# Patient Record
Sex: Male | Born: 1980 | Race: White | Hispanic: No | Marital: Married | Smoking: Never smoker
Health system: Southern US, Community
[De-identification: ages and names within clinical notes are randomized; demographics above are authoritative.]

## PROBLEM LIST (undated history)

## (undated) DIAGNOSIS — D649 Anemia, unspecified: Secondary | ICD-10-CM

## (undated) DIAGNOSIS — K649 Unspecified hemorrhoids: Secondary | ICD-10-CM

## (undated) DIAGNOSIS — F32A Depression, unspecified: Secondary | ICD-10-CM

## (undated) DIAGNOSIS — M1990 Unspecified osteoarthritis, unspecified site: Secondary | ICD-10-CM

## (undated) DIAGNOSIS — S90229A Contusion of unspecified lesser toe(s) with damage to nail, initial encounter: Secondary | ICD-10-CM

## (undated) DIAGNOSIS — F419 Anxiety disorder, unspecified: Secondary | ICD-10-CM

## (undated) DIAGNOSIS — G709 Myoneural disorder, unspecified: Secondary | ICD-10-CM

## (undated) DIAGNOSIS — K625 Hemorrhage of anus and rectum: Secondary | ICD-10-CM

## (undated) DIAGNOSIS — F1910 Other psychoactive substance abuse, uncomplicated: Secondary | ICD-10-CM

## (undated) DIAGNOSIS — R109 Unspecified abdominal pain: Secondary | ICD-10-CM

## (undated) DIAGNOSIS — K635 Polyp of colon: Secondary | ICD-10-CM

## (undated) DIAGNOSIS — K561 Intussusception: Secondary | ICD-10-CM

## (undated) HISTORY — DX: Other psychoactive substance abuse, uncomplicated: F19.10

## (undated) HISTORY — DX: Unspecified hemorrhoids: K64.9

## (undated) HISTORY — DX: Unspecified abdominal pain: R10.9

## (undated) HISTORY — DX: Intussusception: K56.1

## (undated) HISTORY — DX: Contusion of unspecified lesser toe(s) with damage to nail, initial encounter: S90.229A

## (undated) HISTORY — DX: Myoneural disorder, unspecified: G70.9

## (undated) HISTORY — DX: Anemia, unspecified: D64.9

## (undated) HISTORY — DX: Hemorrhage of anus and rectum: K62.5

## (undated) HISTORY — DX: Anxiety disorder, unspecified: F41.9

## (undated) HISTORY — DX: Polyp of colon: K63.5

## (undated) HISTORY — DX: Unspecified osteoarthritis, unspecified site: M19.90

## (undated) HISTORY — DX: Depression, unspecified: F32.A

## (undated) HISTORY — PX: COLECTOMY: SHX59

---

## 2005-02-10 ENCOUNTER — Inpatient Hospital Stay (HOSPITAL_COMMUNITY): Admission: EM | Admit: 2005-02-10 | Discharge: 2005-02-17 | Payer: Self-pay | Admitting: Emergency Medicine

## 2005-03-18 ENCOUNTER — Encounter: Admission: RE | Admit: 2005-03-18 | Discharge: 2005-03-18 | Payer: Self-pay | Admitting: General Surgery

## 2006-03-20 IMAGING — RF DG UGI W/ SMALL BOWEL HIGH DENSITY
19 of 24 series · 19 of 24 positions shown · non-contrast
Comparison: none

CLINICAL DATA: Small bowel intussusception from hamartoma.  Evaluate for other polypoid lesions.
 KUB:
 A preliminary film of the abdomen shows a nonspecific bowel gas pattern. 
 UPPER GI AND SMALL BOWEL FOLLOW THROUGH:
 A double contrast upper GI shows the esophagus to appear normal.  Esophageal peristalsis is normal.  No hiatal hernia or reflux is seen. The stomach is normal in contour and peristalsis. The duodenal bulb fills well with no ulceration, and the duodenal loop is in normal position.
 The patient was given additional barium orally and images of the small bowel were obtained.  The mucosa of the small bowel appears normal.  No persistent filling defect is seen, and no dilatation or edema is noted.  The terminal ileum appears normal.

[Series 2: run · 1 of 1 slices shown (1 of 19)]
[im 1/1]
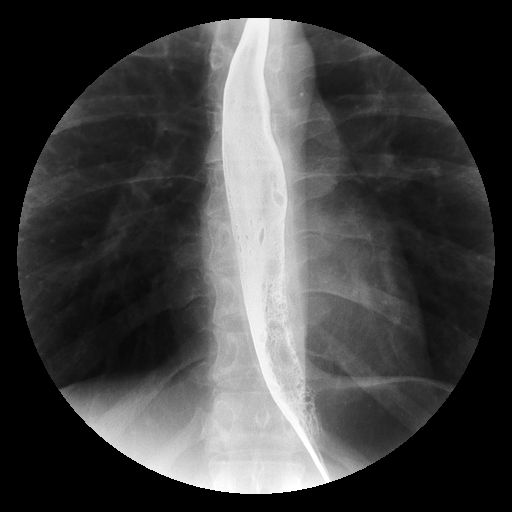

[Series 3: run · 1 of 1 slices shown (2 of 19)]
[im 1/1]
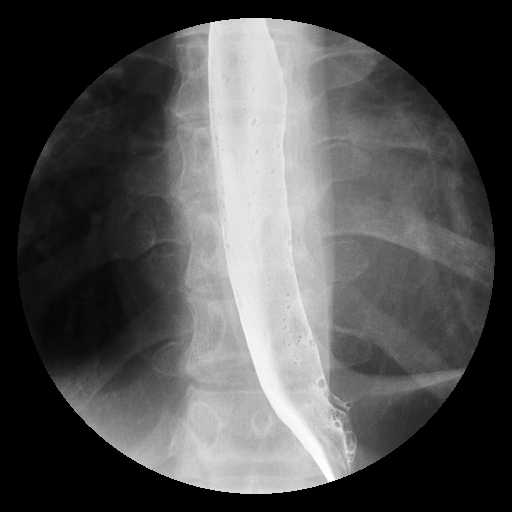

[Series 5: run · 1 of 1 slices shown (3 of 19)]
[im 1/1]
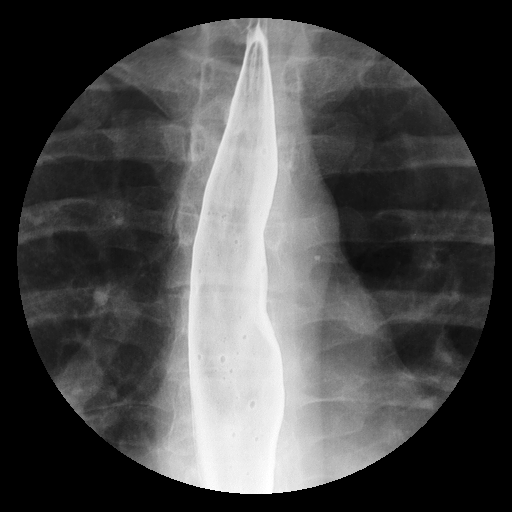

[Series 6: run · 1 of 1 slices shown (4 of 19)]
[im 1/1]
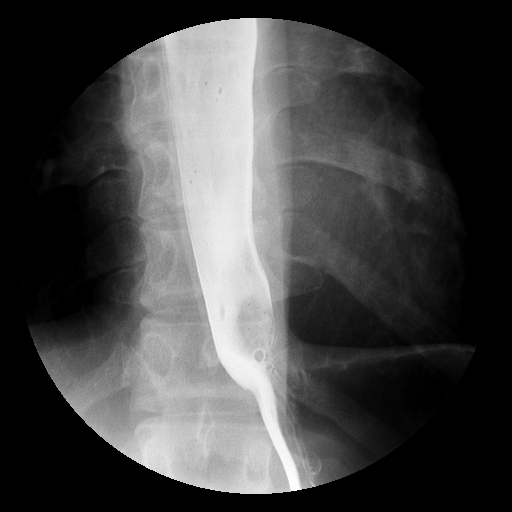

[Series 7: run · 1 of 1 slices shown (5 of 19)]
[im 1/1]
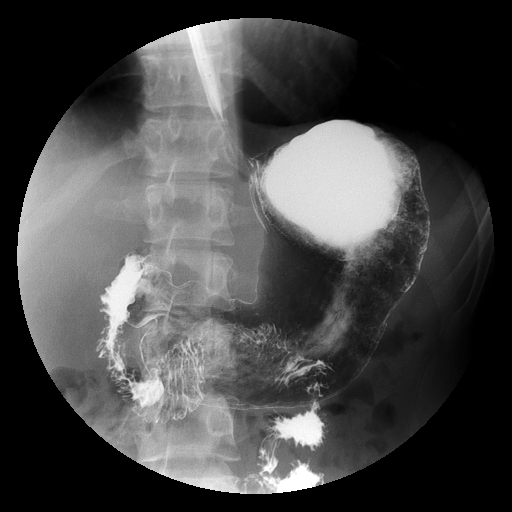

[Series 8: run · 1 of 1 slices shown (6 of 19)]
[im 1/1]
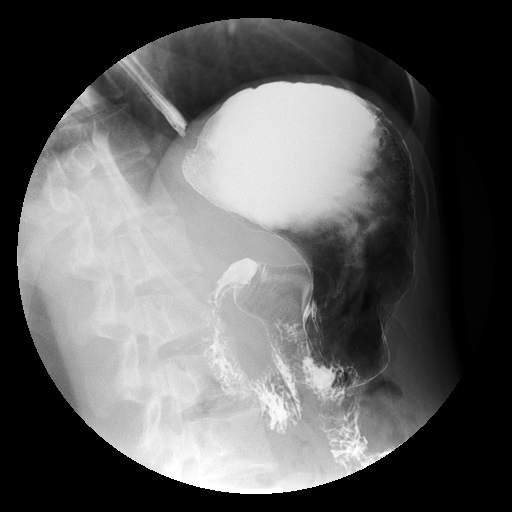

[Series 10: run · 1 of 1 slices shown (7 of 19)]
[im 1/1]
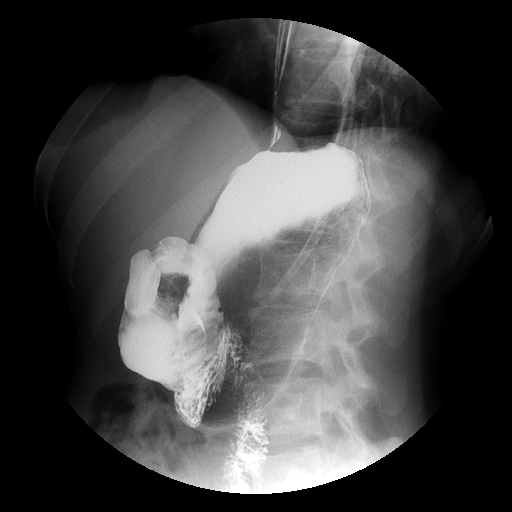

[Series 11: run · 1 of 1 slices shown (8 of 19)]
[im 1/1]
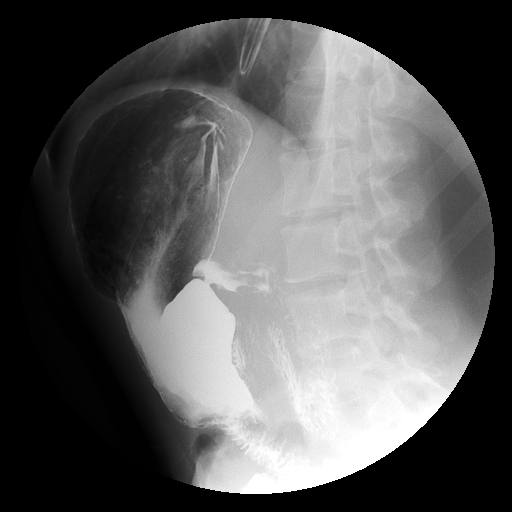

[Series 12: run · 1 of 1 slices shown (9 of 19)]
[im 1/1]
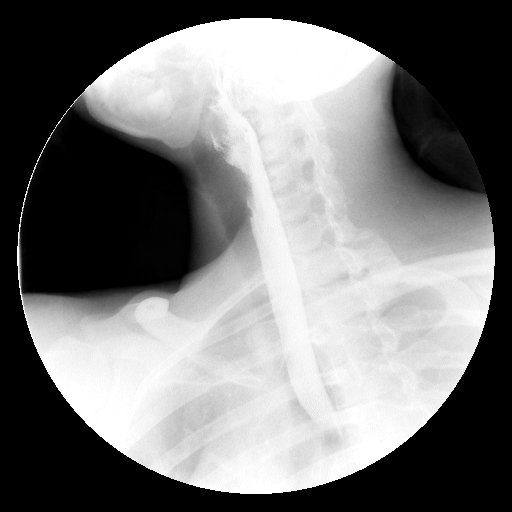

[Series 14: run · 1 of 1 slices shown (10 of 19)]
[im 1/1]
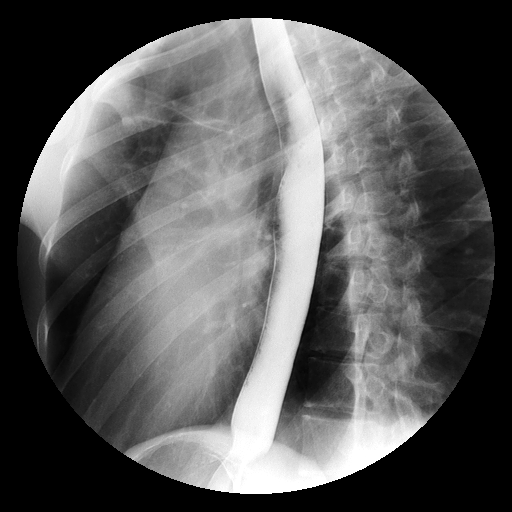

[Series 15: run · 1 of 1 slices shown (11 of 19)]
[im 1/1]
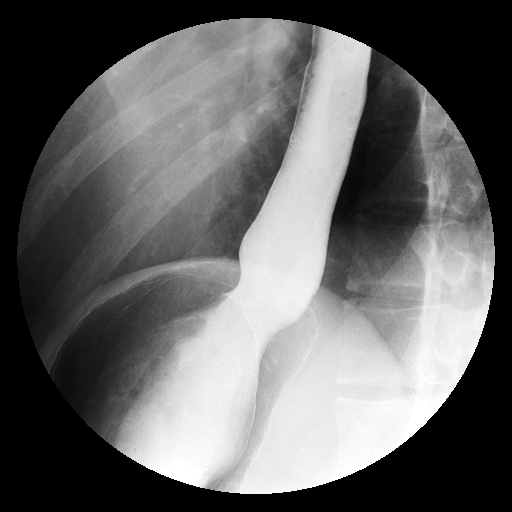

[Series 16: run · 1 of 1 slices shown (12 of 19)]
[im 1/1]
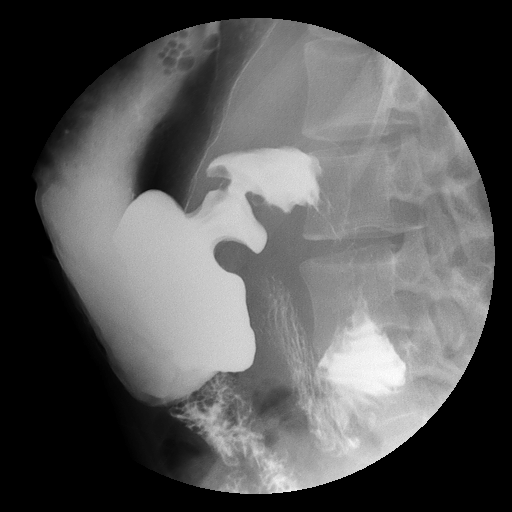

[Series 17: run · 1 of 1 slices shown (13 of 19)]
[im 1/1]
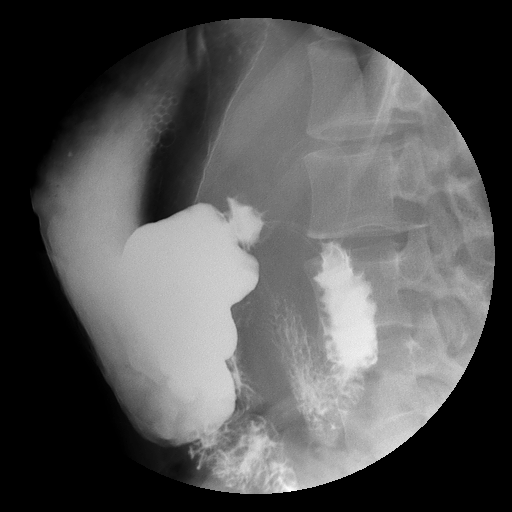

[Series 19: run · 1 of 1 slices shown (14 of 19)]
[im 1/1]
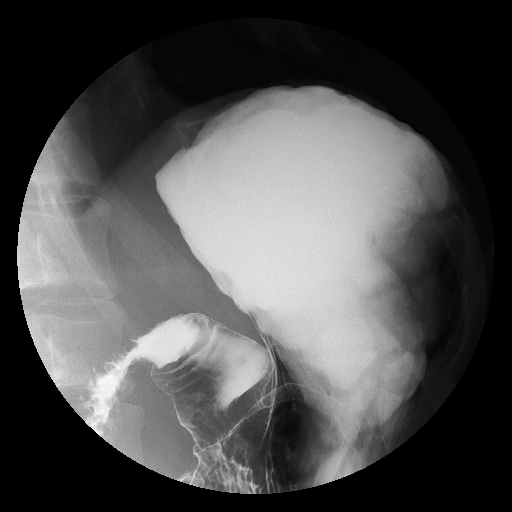

[Series 20: run · 1 of 1 slices shown (15 of 19)]
[im 1/1]
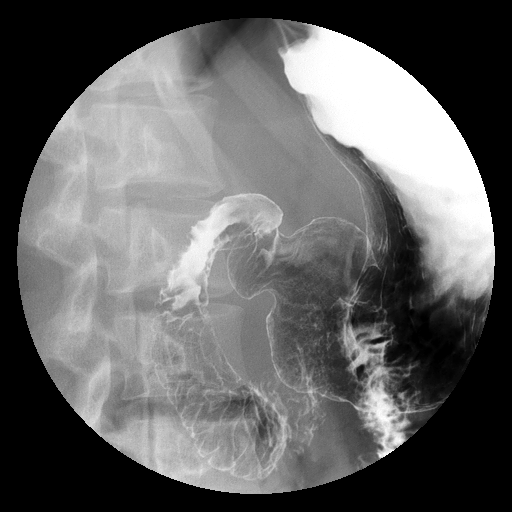

[Series 21: run · 1 of 1 slices shown (16 of 19)]
[im 1/1]
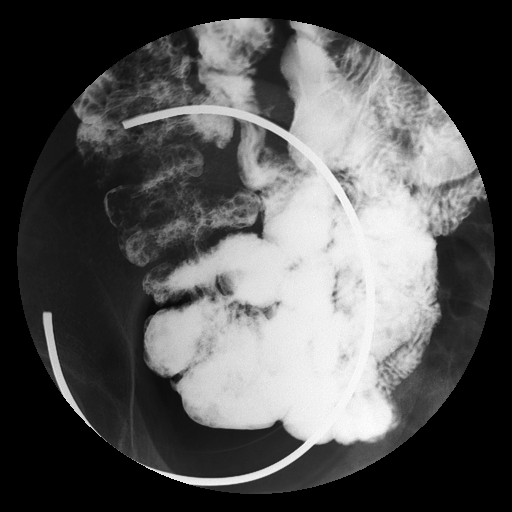

[Series 22: run · 1 of 1 slices shown (17 of 19)]
[im 1/1]
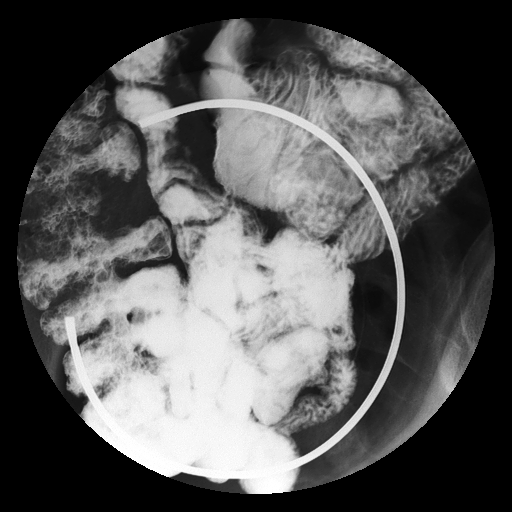

[Series 24: run · 1 of 1 slices shown (18 of 19)]
[im 1/1]
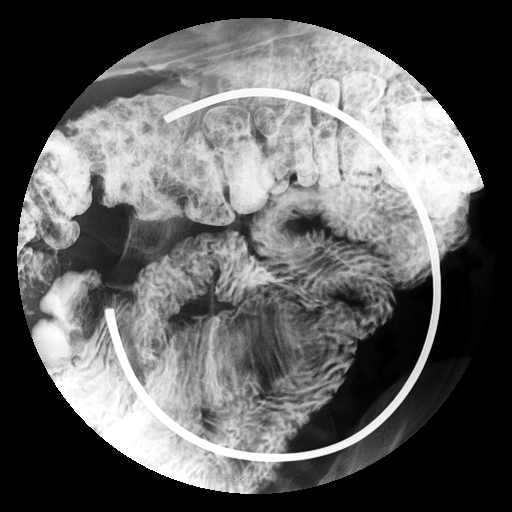

[Series 25: run · 1 of 1 slices shown (19 of 19)]
[im 1/1]
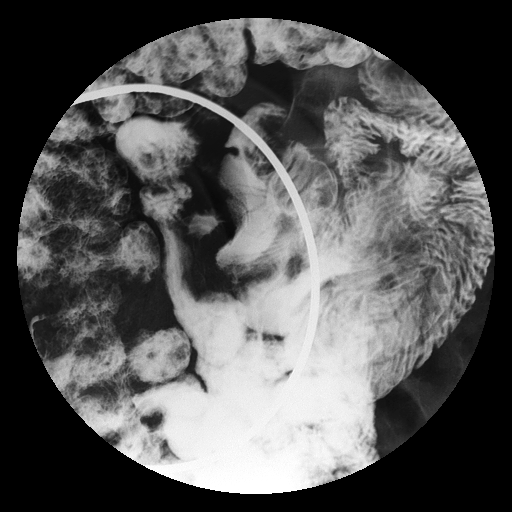

[19 of 24 positions shown; findings below may reference images not displayed]

IMPRESSION: 1.  Negative upper GI.
 2.  Negative small bowel follow through.

## 2017-04-09 ENCOUNTER — Encounter (HOSPITAL_COMMUNITY): Payer: Self-pay | Admitting: Emergency Medicine

## 2017-04-09 ENCOUNTER — Ambulatory Visit (HOSPITAL_COMMUNITY)
Admission: EM | Admit: 2017-04-09 | Discharge: 2017-04-09 | Disposition: A | Discharge status: Home / Self Care | Payer: Medicaid Other | Attending: Family Medicine | Admitting: Family Medicine

## 2017-04-09 DIAGNOSIS — R5383 Other fatigue: Secondary | ICD-10-CM | POA: Diagnosis present

## 2017-04-09 DIAGNOSIS — F1290 Cannabis use, unspecified, uncomplicated: Secondary | ICD-10-CM | POA: Insufficient documentation

## 2017-04-09 DIAGNOSIS — D649 Anemia, unspecified: Secondary | ICD-10-CM | POA: Diagnosis not present

## 2017-04-09 DIAGNOSIS — F4310 Post-traumatic stress disorder, unspecified: Secondary | ICD-10-CM | POA: Insufficient documentation

## 2017-04-09 LAB — TSH: TSH: 0.921 u[IU]/mL (ref 0.350–4.500)

## 2017-04-09 LAB — CBC
HCT: 49 % (ref 39.0–52.0)
Hemoglobin: 17.2 g/dL — ABNORMAL HIGH (ref 13.0–17.0)
MCH: 31.4 pg (ref 26.0–34.0)
MCHC: 35.1 g/dL (ref 30.0–36.0)
MCV: 89.6 fL (ref 78.0–100.0)
PLATELETS: 166 10*3/uL (ref 150–400)
RBC: 5.47 MIL/uL (ref 4.22–5.81)
RDW: 12.7 % (ref 11.5–15.5)
WBC: 6 10*3/uL (ref 4.0–10.5)

## 2017-04-09 NOTE — Discharge Instructions (Addendum)
We will contact you with your lab results. 

## 2017-04-09 NOTE — ED Triage Notes (Signed)
Pt c/o feeling fatigue associated w/SOB w/exertion onset x3 days  Reports he recently became vegan in the past month  Taking iron supplements   Concerned for anemia... Hx of anemia   A&O x4... NAD... Ambulatory
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
## 2018-06-16 ENCOUNTER — Ambulatory Visit: Payer: Self-pay | Admitting: Family Medicine

## 2018-06-19 ENCOUNTER — Encounter: Payer: Self-pay | Admitting: Family Medicine

## 2018-06-19 ENCOUNTER — Ambulatory Visit (INDEPENDENT_AMBULATORY_CARE_PROVIDER_SITE_OTHER): Payer: Medicaid Other | Admitting: Family Medicine

## 2018-06-19 VITALS — BP 118/84 | HR 64 | Temp 98.0°F | Ht 68.0 in | Wt 137.0 lb

## 2018-06-19 DIAGNOSIS — Z131 Encounter for screening for diabetes mellitus: Secondary | ICD-10-CM

## 2018-06-19 DIAGNOSIS — Z7689 Persons encountering health services in other specified circumstances: Secondary | ICD-10-CM | POA: Diagnosis not present

## 2018-06-19 DIAGNOSIS — Z09 Encounter for follow-up examination after completed treatment for conditions other than malignant neoplasm: Secondary | ICD-10-CM

## 2018-06-19 DIAGNOSIS — R197 Diarrhea, unspecified: Secondary | ICD-10-CM | POA: Diagnosis not present

## 2018-06-19 DIAGNOSIS — Z0000 Encounter for general adult medical examination without abnormal findings: Secondary | ICD-10-CM | POA: Diagnosis not present

## 2018-06-19 DIAGNOSIS — R5383 Other fatigue: Secondary | ICD-10-CM

## 2018-06-19 LAB — POCT URINALYSIS DIP (MANUAL ENTRY)
Bilirubin, UA: NEGATIVE
Blood, UA: NEGATIVE
Glucose, UA: NEGATIVE mg/dL
Ketones, POC UA: NEGATIVE mg/dL
Leukocytes, UA: NEGATIVE
Nitrite, UA: NEGATIVE
Protein Ur, POC: NEGATIVE mg/dL
Spec Grav, UA: 1.01 (ref 1.010–1.025)
Urobilinogen, UA: 0.2 E.U./dL
pH, UA: 6.5 (ref 5.0–8.0)

## 2018-06-19 LAB — POCT GLYCOSYLATED HEMOGLOBIN (HGB A1C): Hemoglobin A1C: 4.8 % (ref 4.0–5.6)

## 2018-06-19 NOTE — Progress Notes (Signed)
New Patient--Establish Care  Subjective:    Patient ID: Jackson [name], male    DOB: [birth year], 37 y.o.   MRN: [id number]   Chief Complaint  Patient presents with  . Establish Care   HPI  [name] is a 37 year old male with a past medical history of Acute Sinusitis. He is here today to establish care.   Current Status: Patient is here today be tested for Addison's Disease. He also has a family history of Hyperthyroidism. He has been experiencing increase fatigue. He denies fevers, chills, fatigue, recent infections, weight loss, and night sweats. He continues to experience occasional diarrhea. He has tried 'gluten free' diet for a few months. No reports of other GI problems such as nausea, vomiting, and constipation. He has no reports of blood in stools, dysuria and hematuria.  He has increase muscle aches and cramps, which he takes Cannibis, Magnesium/Chloride baths, Yoga and Motrin as needed. He recently investigated heavy metals and hair follicle test and is currently waiting for results. He practices meditation for relief of anxiety. No depression or anxiety reported.  He has not had anyvisual changes, dizziness, and falls. No chest pain, heart palpitations, cough and shortness of breath reported.  He denies pain today.   History reviewed. No pertinent past medical history.  Family History  Problem Relation Age of Onset  . Heart attack Mother   . Heart disease Mother   . Hypothyroidism Mother   . Heart disease Father     Social History   Socioeconomic History  . Marital status: Married    Spouse name: Not on file  . Number of children: Not on file  . Years of education: Not on file  . Highest education level: Not on file  Occupational History  . Not on file  Social Needs  . Financial resource strain: Not on file  . Food insecurity:    Worry: Not on file    Inability: Not on file  . Transportation needs:    Medical: Not on file    Non-medical: Not on file  Tobacco  Use  . Smoking status: Never Smoker  . Smokeless tobacco: Never Used  Substance and Sexual Activity  . Alcohol use: Never    Frequency: Never  . Drug use: Yes    Types: Marijuana  . Sexual activity: Not on file  Lifestyle  . Physical activity:    Days per week: Not on file    Minutes per session: Not on file  . Stress: Not on file  Relationships  . Social connections:    Talks on phone: Not on file    Gets together: Not on file    Attends religious service: Not on file    Active member of club or organization: Not on file    Attends meetings of clubs or organizations: Not on file    Relationship status: Not on file  . Intimate partner violence:    Fear of current or ex partner: Not on file    Emotionally abused: Not on file    Physically abused: Not on file    Forced sexual activity: Not on file  Other Topics Concern  . Not on file  Social History Narrative  . Not on file    History reviewed. No pertinent surgical history.    There is no immunization history on file for this patient.  No outpatient medications have been marked as taking for the 06/19/18 encounter (Office Visit) with Kallie Locks, FNP.  No Known Allergies  BP 118/84   Pulse 64   Temp 98 F (36.7 C) (Oral)   Ht 5\' 8"  (1.727 m)   Wt 137 lb (62.1 kg)   SpO2 98%   BMI 20.83 kg/m   Review of Systems  Constitutional: Positive for fatigue (increased fatigue. ).  HENT: Negative.   Eyes: Negative.   Respiratory: Negative.   Cardiovascular: Negative.   Gastrointestinal: Positive for diarrhea (occasional ).  Endocrine: Negative.   Genitourinary: Negative.   Musculoskeletal: Positive for arthralgias (generalized mucle aches and cramps.).  Skin: Negative.   Allergic/Immunologic: Negative.   Neurological: Negative.   Hematological: Negative.   Psychiatric/Behavioral: The patient is nervous/anxious.    Objective:   Physical Exam  Constitutional: He is oriented to person, place, and time.  He appears well-developed and well-nourished.  HENT:  Head: Normocephalic and atraumatic.  Right Ear: External ear normal.  Left Ear: External ear normal.  Nose: Nose normal.  Mouth/Throat: Oropharynx is clear and moist.  Eyes: Pupils are equal, round, and reactive to light. Conjunctivae and EOM are normal.  Neck: Normal range of motion. Neck supple.  Cardiovascular: Normal rate, regular rhythm, normal heart sounds and intact distal pulses.  Pulmonary/Chest: Effort normal and breath sounds normal.  Abdominal: Soft. Bowel sounds are normal.  Musculoskeletal: Normal range of motion.  Neurological: He is alert and oriented to person, place, and time.  Skin: Skin is warm and dry. Capillary refill takes less than 2 seconds.  Psychiatric: He has a normal mood and affect. His behavior is normal. Judgment and thought content normal.  Mildly anxious  Nursing note and vitals reviewed.  Assessment & Plan:   1. Encounter to establish care We /will draw labs to assess for Addison's Disease, Thyroid Disease, and Anemia as r/t fatigue.   2. Healthcare maintenance - CBC with Differential - Comprehensive metabolic panel - TSH - PSA - Lipid Panel - Cortisol  3. Fatigue, unspecified type  4. Diarrhea, unspecified type Stable. Not worsening.   5. Screening for diabetes mellitus Hgb A1c is normal at 4.8 today. He will continue to decrease foods/beverages high in sugars and carbs and follow Heart Healthy or DASH diet. Increase physical activity to at least 30 minutes cardio exercise daily.   - POCT glycosylated hemoglobin (Hb A1C) - POCT urinalysis dipstick  6. Follow up He will follow up in 3 months. 8:00am and  He will follow up tomorrow (06/20/2018) for to have Cortisol levels drawn. Appointments for 8:00 am and 3:30 pm.   No orders of the defined types were placed in this encounter.  Raliegh Ip,  MSN, FNP-C Patient Care [facility] [address] [place], [place] [address] [phone]

## 2018-06-20 ENCOUNTER — Other Ambulatory Visit: Payer: Medicaid Other

## 2018-06-20 DIAGNOSIS — Z0000 Encounter for general adult medical examination without abnormal findings: Secondary | ICD-10-CM

## 2018-06-20 LAB — COMPREHENSIVE METABOLIC PANEL
ALT: 22 IU/L (ref 0–44)
AST: 21 IU/L (ref 0–40)
Albumin/Globulin Ratio: 2.4 — ABNORMAL HIGH (ref 1.2–2.2)
Albumin: 5.1 g/dL (ref 3.5–5.5)
Alkaline Phosphatase: 83 IU/L (ref 39–117)
BUN/Creatinine Ratio: 11 (ref 9–20)
BUN: 10 mg/dL (ref 6–20)
Bilirubin Total: 0.4 mg/dL (ref 0.0–1.2)
CO2: 24 mmol/L (ref 20–29)
Calcium: 10 mg/dL (ref 8.7–10.2)
Chloride: 102 mmol/L (ref 96–106)
Creatinine, Ser: 0.91 mg/dL (ref 0.76–1.27)
GFR calc Af Amer: 126 mL/min/{1.73_m2} (ref >59)
GFR calc non Af Amer: 109 mL/min/{1.73_m2} (ref >59)
Globulin, Total: 2.1 g/dL (ref 1.5–4.5)
Glucose: 91 mg/dL (ref 65–99)
Potassium: 4.4 mmol/L (ref 3.5–5.2)
Sodium: 144 mmol/L (ref 134–144)
Total Protein: 7.2 g/dL (ref 6.0–8.5)

## 2018-06-20 LAB — CBC WITH DIFFERENTIAL/PLATELET
Basophils Absolute: 0 10*3/uL (ref 0.0–0.2)
Basos: 1 %
EOS (ABSOLUTE): 0.1 10*3/uL (ref 0.0–0.4)
Eos: 1 %
Hematocrit: 48 % (ref 37.5–51.0)
Hemoglobin: 16.9 g/dL (ref 13.0–17.7)
Immature Grans (Abs): 0 10*3/uL (ref 0.0–0.1)
Immature Granulocytes: 0 %
Lymphocytes Absolute: 1.4 10*3/uL (ref 0.7–3.1)
Lymphs: 22 %
MCH: 31.9 pg (ref 26.6–33.0)
MCHC: 35.2 g/dL (ref 31.5–35.7)
MCV: 91 fL (ref 79–97)
Monocytes Absolute: 0.4 10*3/uL (ref 0.1–0.9)
Monocytes: 6 %
Neutrophils Absolute: 4.5 10*3/uL (ref 1.4–7.0)
Neutrophils: 70 %
Platelets: 211 10*3/uL (ref 150–450)
RBC: 5.3 x10E6/uL (ref 4.14–5.80)
RDW: 12.2 % — ABNORMAL LOW (ref 12.3–15.4)
WBC: 6.4 10*3/uL (ref 3.4–10.8)

## 2018-06-20 LAB — PSA: Prostate Specific Ag, Serum: 0.9 ng/mL (ref 0.0–4.0)

## 2018-06-20 LAB — LIPID PANEL
Chol/HDL Ratio: 2.7 ratio (ref 0.0–5.0)
Cholesterol, Total: 113 mg/dL (ref 100–199)
HDL: 42 mg/dL (ref >39)
LDL Calculated: 61 mg/dL (ref 0–99)
Triglycerides: 49 mg/dL (ref 0–149)
VLDL Cholesterol Cal: 10 mg/dL (ref 5–40)

## 2018-06-20 LAB — TSH: TSH: 1.16 u[IU]/mL (ref 0.450–4.500)

## 2018-06-20 NOTE — Progress Notes (Signed)
Patient for Cortisol AM and PM lab draws today.

## 2018-06-22 LAB — CORTISOL-PM, BLOOD: Cortisol - PM: 7.8 ug/dL (ref 2.3–11.9)

## 2018-06-22 LAB — CORTISOL-AM, BLOOD: Cortisol - AM: 14.6 ug/dL (ref 6.2–19.4)

## 2018-06-28 ENCOUNTER — Telehealth: Payer: Self-pay

## 2018-06-28 NOTE — Telephone Encounter (Signed)
Left a vm for patient to callback 

## 2018-06-28 NOTE — Telephone Encounter (Signed)
-----   Message from Kallie LocksNatalie M Stroud, FNP sent at 06/28/2018  2:30 PM EDT ----- Regarding: "Lab Results" Jackson [name],   Please inform patient that all lab results were within normal range.  "AM and PM" Cortisol levels are within normal range also.   Thank you.

## 2018-06-29 NOTE — Telephone Encounter (Signed)
-----   Message from Kallie LocksNatalie M Stroud, FNP sent at 06/28/2018  2:30 PM EDT ----- Regarding: "Lab Results" Lyla Sonarrie,   Please inform patient that all lab results were within normal range.  "AM and PM" Cortisol levels are within normal range also.   Thank you.

## 2018-06-29 NOTE — Telephone Encounter (Signed)
Patient notified

## 2018-07-04 ENCOUNTER — Ambulatory Visit (INDEPENDENT_AMBULATORY_CARE_PROVIDER_SITE_OTHER): Payer: Medicaid Other | Admitting: Family Medicine

## 2018-07-04 ENCOUNTER — Encounter: Payer: Self-pay | Admitting: Family Medicine

## 2018-07-04 VITALS — BP 116/60 | HR 60 | Temp 97.6°F | Ht 68.0 in | Wt 142.6 lb

## 2018-07-04 DIAGNOSIS — Z09 Encounter for follow-up examination after completed treatment for conditions other than malignant neoplasm: Secondary | ICD-10-CM

## 2018-07-04 DIAGNOSIS — Z77018 Contact with and (suspected) exposure to other hazardous metals: Secondary | ICD-10-CM | POA: Diagnosis not present

## 2018-07-04 DIAGNOSIS — Z0000 Encounter for general adult medical examination without abnormal findings: Secondary | ICD-10-CM | POA: Diagnosis not present

## 2018-07-04 DIAGNOSIS — S90221A Contusion of right lesser toe(s) with damage to nail, initial encounter: Secondary | ICD-10-CM | POA: Diagnosis not present

## 2018-07-04 NOTE — Progress Notes (Signed)
Follow Up  Subjective:    Patient ID: Jackson [name], male    DOB: 09/02/1981, 37 y.o.   MRN: [id number]   Chief Complaint  Patient presents with  . Follow-up    lab results    HPI  [name] is a 37 year old male with no pertinent past medical history.   Current Status: Since his last office visit, he is doing well with no complaints. He is requesting 24-hr Urine today.   She denies fevers, chills, fatigue, recent infections, weight loss, and night sweats. She has not had any headaches, visual changes, dizziness, and falls. No chest pain, heart palpitations, cough and shortness of breath reported. No reports of GI problems such as nausea, vomiting, diarrhea, and constipation. She has no reports of blood in stools, dysuria and hematuria. No depression or anxiety, and denies suicidal ideations, homicidal ideations, or auditory hallucinations. She denies pain today.   History reviewed. No pertinent past medical history.  Family History  Problem Relation Age of Onset  . Heart attack Mother   . Heart disease Mother   . Hypothyroidism Mother   . Heart disease Father     Social History   Socioeconomic History  . Marital status: Married    Spouse name: Not on file  . Number of children: Not on file  . Years of education: Not on file  . Highest education level: Not on file  Occupational History  . Not on file  Social Needs  . Financial resource strain: Not on file  . Food insecurity:    Worry: Not on file    Inability: Not on file  . Transportation needs:    Medical: Not on file    Non-medical: Not on file  Tobacco Use  . Smoking status: Never Smoker  . Smokeless tobacco: Never Used  Substance and Sexual Activity  . Alcohol use: Never    Frequency: Never  . Drug use: Yes    Types: Marijuana  . Sexual activity: Not on file  Lifestyle  . Physical activity:    Days per week: Not on file    Minutes per session: Not on file  . Stress: Not on file  Relationships  .  Social connections:    Talks on phone: Not on file    Gets together: Not on file    Attends religious service: Not on file    Active member of club or organization: Not on file    Attends meetings of clubs or organizations: Not on file    Relationship status: Not on file  . Intimate partner violence:    Fear of current or ex partner: Not on file    Emotionally abused: Not on file    Physically abused: Not on file    Forced sexual activity: Not on file  Other Topics Concern  . Not on file  Social History Narrative  . Not on file    History reviewed. No pertinent surgical history.   There is no immunization history on file for this patient.  No outpatient medications have been marked as taking for the 07/04/18 encounter (Office Visit) with Kallie LocksStroud, Jaiel Saraceno M, FNP.    No Known Allergies  BP 116/60 (BP Location: Left Arm, Patient Position: Sitting, Cuff Size: Small)   Pulse 60   Temp 97.6 F (36.4 C) (Oral)   Ht 5\' 8"  (1.727 m)   Wt 142 lb 9.6 oz (64.7 kg)   SpO2 100%   BMI 21.68 kg/m   Review  of Systems  Constitutional: Negative.   HENT: Negative.   Respiratory: Negative.   Cardiovascular: Negative.   Gastrointestinal: Negative.   Genitourinary: Negative.   Musculoskeletal: Negative.   Skin: Negative.   Allergic/Immunologic: Negative.   Neurological: Negative.   Hematological: Negative.   Psychiatric/Behavioral: Negative.    Objective:   Physical Exam  Constitutional: He is oriented to person, place, and time. He appears well-developed and well-nourished.  Cardiovascular: Normal rate, regular rhythm, normal heart sounds and intact distal pulses.  Pulmonary/Chest: Effort normal and breath sounds normal.  Abdominal: Soft. Bowel sounds are normal.  Musculoskeletal: Normal range of motion.  Neurological: He is alert and oriented to person, place, and time.  Skin: Skin is warm and dry.  Psychiatric: He has a normal mood and affect. His behavior is normal. Judgment  and thought content normal.  Nursing note and vitals reviewed.  Assessment & Plan:   1. Suspected exposure to heavy metals We will evaluate for heavy metals with 24 hour Urine.   2. Toenail bruise, right, initial encounter - Ambulatory referral to Podiatry  3. Healthcare maintenance - Lithium level - Heavy Metals Profile II, Urine - Copper, Urine  4. Follow up He will follow up in 3 months.   No orders of the defined types were placed in this encounter.   Raliegh Ip,  MSN, FNP-C Patient Care [facility] [address] [place], [place] [address] [phone]

## 2018-07-05 LAB — LITHIUM LEVEL: Lithium Lvl: <0.1 mmol/L — ABNORMAL LOW (ref 0.6–1.2)

## 2018-07-12 LAB — HEAVY METALS PROFILE II, URINE
Arsenic Ur: NOT DETECTED ug/L (ref 0–50)
Arsenic(Inorganic),U: NOT DETECTED ug/L (ref 0–19)
Cadmium, Ur: NOT DETECTED ug/L
Creatinine(Crt),U: 0.47 g/L (ref 0.30–3.00)
Lead, Rand Ur: NOT DETECTED ug/L (ref 0–49)
Mercury, Ur: NOT DETECTED ug/L (ref 0–19)

## 2018-07-12 LAB — COPPER, URINE - RANDOM OR 24 HOUR
Copper / Creatinine Ratio: 11 ug/g creat (ref 0–49)
Copper, 24H Ur: 15 ug/24 hr (ref 3–35)
Copper, Ur: 5 ug/L

## 2018-08-09 ENCOUNTER — Ambulatory Visit: Payer: Medicaid Other | Admitting: Podiatry

## 2018-08-09 ENCOUNTER — Encounter: Payer: Self-pay | Admitting: Podiatry

## 2018-08-09 VITALS — BP 110/68 | HR 52

## 2018-08-09 DIAGNOSIS — L603 Nail dystrophy: Secondary | ICD-10-CM

## 2018-08-14 NOTE — Progress Notes (Signed)
   Subjective: 37 year old male presenting today as a new patient with a chief complaint of a thickened, discolored right great toenail that began several years ago after an injury. He states he dropped something of the toe on two separate occasions. The nail has since started growing in improperly and lifting from the nail bed. He has not done anything for treatment. There are no modifying factors noted. Patient is here for further evaluation and treatment.   History reviewed. No pertinent past medical history.  Objective: Physical Exam General: The patient is alert and oriented x3 in no acute distress.  Dermatology: Hyperkeratotic, discolored, thickened, onychodystrophy of the right great toenail. Skin is warm, dry and supple bilateral lower extremities. Negative for open lesions or macerations.  Vascular: Palpable pedal pulses bilaterally. No edema or erythema noted. Capillary refill within normal limits.  Neurological: Epicritic and protective threshold grossly intact bilaterally.   Musculoskeletal Exam: Range of motion within normal limits to all pedal and ankle joints bilateral. Muscle strength 5/5 in all groups bilateral.   Assessment: #1 Dystrophic nail right hallux   Plan of Care:  #1 Patient was evaluated. #2 Mechanical debridement of the right great toenail performed using a nail nipper. Filed with dremel without incident.  #3 Patient going to [place] with his family. Return after for total permanent nail avulsion procedure.  #4 Return to clinic as needed for total permanent nail avulsion procedure.   Goes by [place].    Felecia Shelling, DPM [facility]  Dr. Felecia Shelling, DPM    [address]                                        [place], [place] [address]                Office [phone]  Fax [phone]

## 2018-08-30 ENCOUNTER — Encounter: Payer: Self-pay | Admitting: Podiatry

## 2018-08-30 ENCOUNTER — Ambulatory Visit: Payer: Medicaid Other | Admitting: Podiatry

## 2018-08-30 DIAGNOSIS — L603 Nail dystrophy: Secondary | ICD-10-CM

## 2018-08-30 MED ORDER — GENTAMICIN SULFATE 0.1 % EX CREA: 1.0000 "application " | TOPICAL_CREAM | Freq: Two times a day (BID) | CUTANEOUS | 1 refills | Status: DC

## 2018-08-30 NOTE — Patient Instructions (Signed)

## 2018-09-03 NOTE — Progress Notes (Signed)
   Subjective: 37 year old male presenting today with for follow up evaluation of thickening and discoloration of the right great toenail. He has not done anything for treatment since last visit and denies modifying factors. Patient is here for further evaluation and treatment.   History reviewed. No pertinent past medical history.  Objective:  General: Well developed, nourished, in no acute distress, alert and oriented x3   Dermatology: Skin is warm, dry and supple bilateral. Right great toenail appears to be erythematous with evidence of an ingrowing nail. Pain on palpation noted to the border of the nail fold. The remaining nails appear unremarkable at this time. There are no open sores, lesions.  Vascular: Dorsalis Pedis artery and Posterior Tibial artery pedal pulses palpable. No lower extremity edema noted.   Neruologic: Grossly intact via light touch bilateral.  Musculoskeletal: Muscular strength within normal limits in all groups bilateral. Normal range of motion noted to all pedal and ankle joints.   Assesement: #1 Dystrophic nail right hallux   Plan of Care:  1. Patient evaluated.  2. Discussed treatment alternatives and plan of care. Explained nail avulsion procedure and post procedure course to patient. 3. Patient opted for permanent total nail avulsion of the right hallux.  4. Prior to procedure, local anesthesia infiltration utilized using 3 ml of a 50:50 mixture of 2% plain lidocaine and 0.5% plain marcaine in a normal hallux block fashion and a betadine prep performed.  5. Total permanent nail avulsion with chemical matrixectomy performed using 3x30sec applications of phenol followed by alcohol flush.  6. Light dressing applied. 7. Prescription for Gentamicin cream provided to patient.  8. Return to clinic in 3 weeks.   Felecia ShellingBrent M. Evans, DPM [facility]  Dr. Felecia ShellingBrent M. Evans, DPM    [address]                                        [place],  [place] [id number]                Office [phone]) [phone]  Fax [phone](336) [phone]

## 2018-09-18 ENCOUNTER — Encounter: Payer: Self-pay | Admitting: Family Medicine

## 2018-09-18 ENCOUNTER — Ambulatory Visit (INDEPENDENT_AMBULATORY_CARE_PROVIDER_SITE_OTHER): Payer: Medicaid Other | Admitting: Family Medicine

## 2018-09-18 VITALS — BP 112/64 | HR 68 | Temp 98.4°F | Ht 68.0 in | Wt 152.6 lb

## 2018-09-18 DIAGNOSIS — Z0000 Encounter for general adult medical examination without abnormal findings: Secondary | ICD-10-CM

## 2018-09-18 DIAGNOSIS — K625 Hemorrhage of anus and rectum: Secondary | ICD-10-CM

## 2018-09-18 DIAGNOSIS — Z09 Encounter for follow-up examination after completed treatment for conditions other than malignant neoplasm: Secondary | ICD-10-CM

## 2018-09-18 DIAGNOSIS — S90221A Contusion of right lesser toe(s) with damage to nail, initial encounter: Secondary | ICD-10-CM | POA: Diagnosis not present

## 2018-09-18 LAB — POCT URINALYSIS DIP (MANUAL ENTRY)
Bilirubin, UA: NEGATIVE
Blood, UA: NEGATIVE
Glucose, UA: NEGATIVE mg/dL
Ketones, POC UA: NEGATIVE mg/dL
Leukocytes, UA: NEGATIVE
Nitrite, UA: NEGATIVE
Protein Ur, POC: NEGATIVE mg/dL
Spec Grav, UA: 1.01 (ref 1.010–1.025)
Urobilinogen, UA: 0.2 E.U./dL
pH, UA: 7 (ref 5.0–8.0)

## 2018-09-18 NOTE — Progress Notes (Signed)
Follow Up  Subjective:    Patient ID: Jackson [name], male    DOB: 12/16/1980, 37 y.o.   MRN: [id number]   Chief Complaint  Patient presents with  . Rectal Bleeding  . Abdominal Cramping  . LABS    Lithium    HPI  Jackson [name] is a 37 year old male with a past medical history of Toenail Problems, Abdominal Cramping, and Rectal Bleeding. He is here today for follow up.    Current Status: Since his last office visit, he is doing well with no complaints.   He has been taking Lithium supplement for a few months lately.   He states that he recently took natural colon cleanser as prescribed by Reflexology, about 1 month ago, and notice a few mild rectal bleeding and abdominal cramping afterwards. He has occasional diarrhea. He is vegan. No other reports of GI problems such as nausea, vomiting, and constipation. He has no reports of dysuria and hematuria.   He denies fevers, chills, fatigue, recent infections, weight loss, and night sweats. He has not had any headaches, visual changes, dizziness, and falls. No chest pain, heart palpitations, cough and shortness of breath reported. No depression or anxiety, and denies suicidal ideations, homicidal ideations, or auditory hallucinations. He denies pain today.    Review of Systems  Constitutional: Negative.   HENT: Negative.   Eyes: Negative.   Respiratory: Negative.   Cardiovascular: Negative.   Gastrointestinal: Positive for blood in stool (Occasional) and diarrhea (Occasional).  Endocrine: Negative.   Genitourinary: Negative.   Musculoskeletal: Negative.   Skin: Negative.   Allergic/Immunologic: Negative.   Neurological: Negative.   Hematological: Negative.   Psychiatric/Behavioral: Negative.    Objective:   Physical Exam  Constitutional: He is oriented to person, place, and time. He appears well-developed and well-nourished.  HENT:  Head: Normocephalic and atraumatic.  Eyes: Pupils are equal, round, and reactive to light.  Conjunctivae and EOM are normal.  Neck: Normal range of motion. Neck supple.  Cardiovascular: Normal rate, regular rhythm, normal heart sounds and intact distal pulses.  Pulmonary/Chest: Effort normal and breath sounds normal.  Abdominal: Soft. Bowel sounds are normal.  Musculoskeletal: Normal range of motion.  Neurological: He is alert and oriented to person, place, and time.  Skin: Skin is warm and dry.  Psychiatric: He has a normal mood and affect. His behavior is normal. Judgment and thought content normal.  Nursing note and vitals reviewed.  Assessment & Plan:   1. Healthcare maintenance - Lithium level  2. Rectal bleeding Fecal Occult Blood is negative. Patient given materials to check at home if he notices blood in stools, to return to office for testing. Monitor.  - Fecal Occult Blood, Guaiac(Harvest)  3. Toenail bruise, right, initial encounter Resolved. Received treatment from Podiatry.   4. Follow up He will follow up in 6 months.  - POCT urinalysis dipstick   No orders of the defined types were placed in this encounter.  Orders Placed This Encounter  Procedures  . Lithium level  . Fecal Occult Blood, Guaiac(Harvest)  . POCT urinalysis dipstick   Raliegh IpNatalie Quinnton Bury,  MSN, FNP-C [facility] [address]  [place], [place] [address] [phone]

## 2018-09-19 LAB — LITHIUM LEVEL: Lithium Lvl: 0.1 mmol/L — ABNORMAL LOW (ref 0.6–1.2)

## 2018-09-20 ENCOUNTER — Encounter: Payer: Self-pay | Admitting: Podiatry

## 2018-09-20 ENCOUNTER — Ambulatory Visit: Payer: Medicaid Other | Admitting: Podiatry

## 2018-09-20 DIAGNOSIS — L600 Ingrowing nail: Secondary | ICD-10-CM | POA: Diagnosis not present

## 2018-09-20 DIAGNOSIS — L603 Nail dystrophy: Secondary | ICD-10-CM

## 2018-09-24 NOTE — Progress Notes (Signed)
   Subjective: Patient presents today 2 weeks post total nail avulsion procedure of the right great toenail. He reports some mild tenderness and reports some scabbing. Touching the area increases the tenderness. He has not done anything for treatment. Patient is here for further evaluation and treatment.   Past Medical History:  Diagnosis Date  . Abdominal cramping   . Rectal bleeding   . Toenail bruise     Objective: Skin is warm, dry and supple. Nail bed and respective nail fold appears to be healing appropriately. Open wound to the associated nail fold with a granular wound base and moderate amount of fibrotic tissue. Minimal drainage noted. Mild erythema around the periungual region likely due to phenol chemical matricectomy.  Assessment: #1 postop permanent total nail avulsion #2 open wound periungual nail fold and nail bed of respective digit.   Plan of care: #1 patient was evaluated  #2 debridement of open wound was performed to the periungual border and nail fold of the respective toe using a currette. Antibiotic ointment and Band-Aid was applied. #3 patient is to return to clinic on a PRN  basis.   Felecia ShellingBrent M. Girlie Veltri, DPM [facility]  Dr. Felecia ShellingBrent M. Deano Tomaszewski, DPM    [address]                                        [place], [place] [id number]                Office [phone]  Fax [phone]

## 2018-09-29 NOTE — Addendum Note (Signed)
Addended by: Lisabeth PickHENDRICKS, Halston Kintz L on: 09/29/2018 08:24 AM   Modules accepted: Orders

## 2018-09-29 NOTE — Addendum Note (Signed)
Addended by: Lisabeth PickHENDRICKS, Lavell Ridings L on: 09/29/2018 08:25 AM   Modules accepted: Orders

## 2019-03-20 ENCOUNTER — Ambulatory Visit: Payer: Medicaid Other | Admitting: Family Medicine

## 2019-03-30 ENCOUNTER — Telehealth: Payer: Self-pay | Admitting: Family Medicine

## 2019-04-02 ENCOUNTER — Other Ambulatory Visit: Payer: Medicaid Other

## 2019-04-02 ENCOUNTER — Telehealth: Payer: Self-pay

## 2019-04-02 ENCOUNTER — Other Ambulatory Visit: Payer: Self-pay

## 2019-04-02 DIAGNOSIS — K625 Hemorrhage of anus and rectum: Secondary | ICD-10-CM

## 2019-04-02 NOTE — Telephone Encounter (Signed)
Sent to nurse

## 2019-04-02 NOTE — Telephone Encounter (Signed)
Spoke with patient.

## 2019-04-02 NOTE — Telephone Encounter (Signed)
Patient brought colguard card back in office and it was positive for blood.

## 2019-04-03 ENCOUNTER — Other Ambulatory Visit: Payer: Self-pay | Admitting: Family Medicine

## 2019-04-03 ENCOUNTER — Encounter: Payer: Self-pay | Admitting: Family Medicine

## 2019-04-03 DIAGNOSIS — K625 Hemorrhage of anus and rectum: Secondary | ICD-10-CM

## 2019-12-31 ENCOUNTER — Other Ambulatory Visit: Payer: Self-pay

## 2019-12-31 ENCOUNTER — Encounter: Payer: Self-pay | Admitting: Family Medicine

## 2019-12-31 ENCOUNTER — Ambulatory Visit (INDEPENDENT_AMBULATORY_CARE_PROVIDER_SITE_OTHER): Payer: Medicaid Other | Admitting: Family Medicine

## 2019-12-31 ENCOUNTER — Ambulatory Visit: Payer: Self-pay

## 2019-12-31 VITALS — BP 110/78 | HR 54 | Ht 68.0 in | Wt 146.4 lb

## 2019-12-31 DIAGNOSIS — M25532 Pain in left wrist: Secondary | ICD-10-CM

## 2019-12-31 DIAGNOSIS — M778 Other enthesopathies, not elsewhere classified: Secondary | ICD-10-CM | POA: Diagnosis not present

## 2019-12-31 DIAGNOSIS — G5623 Lesion of ulnar nerve, bilateral upper limbs: Secondary | ICD-10-CM

## 2019-12-31 DIAGNOSIS — M25531 Pain in right wrist: Secondary | ICD-10-CM

## 2019-12-31 NOTE — Progress Notes (Signed)
    Subjective:    CC: B wrist pain  I, Jackson [name], LAT, ATC, am serving as scribe for Dr. Clementeen [name].  HPI: Pt is a 39 y/o male presenting w/ c/o chronic B wrist pain.  He recalls no specific MOI but does play a lot of insturments, including piano and drums.  He states that he is only able to play x 10-15 min before he has to stop due to pain.  He locates his pain to mainly dorsal, ulnar-sided wrist pain but also some on the volar side .  He rates his L wrist pain at a 7/10 at it's worst and describes his pain as aching on average but can get throbbing and burning .  Radiating pain: yes into his B forearms L wrist swelling: No L wrist mechanical symptoms: Yes Numbness tingling: Yes, B 4th and 5th fingers, worse on the R Aggravating factors: Playing instruments; woodworking; cooking Treatments tried: Magnesium sulfate rub; fibromyalgia cream; CBD oil  Pertinent review of Systems: No fevers or chills  Relevant historical information: No significant medical history at time of visit.   Objective:    Vitals:   12/31/19 1258  BP: 110/78  Pulse: (!) 54  SpO2: 98%   General: Well Developed, well nourished, and in no acute distress.   MSK: Right elbow normal-appearing normal motion. Positive Tinel's cubital tunnel. Normal strength.  Right wrist normal-appearing. Normal wrist motion. Tender palpation dorsal ulnar wrist at ulnar styloid. Some pain with resisted wrist extension. Negative Tinel's carpal tunnel. Pulses cap refill sensation intact distally  Left elbow normal-appearing normal motion. Normal strength. Positive Tinel's cubital tunnel. Left wrist normal-appearing normal motion.  Tender palpation dorsal wrist near ulnar styloid. Some pain with resisted wrist extension. Negative Tinel's carpal tunnel. Pulses cap refill and sensation intact distally.  Lab and Radiology Results  Diagnostic Limited MSK Ultrasound of: Right elbow and wrists bilateral Right elbow:  Ulnar nerve visible and cubital tunnel.  Direct pressure in this area does reproduce paresthesias distally. Right wrist: Hypoechoic fluid surrounding extensor carpi ulnaris tendon dorsal ulnar wrist.  Area of tenderness with ultrasound probe correlated ECU tendon.  No tendon defects.  No wrist effusion.  No bony changes. Left wrist again hypoechoic fluid surrounding extensor carpi ulnaris tendon.  Area of tenderness correlates with ECU tendon.  No defects wrist effusion or bony changes. Impression: Cubital tunnel syndrome bilaterally and extensor carpi ulnaris tendinitis bilateral.    Impression and Recommendations:    Assessment and Plan: 39 y.o. male with home exercise program taught in clinic today.  Additionally recommend night splints for her elbows.  Treatment with Voltaren gel.  If not improving patient return to clinic for injection likely Orders Placed This Encounter  Procedures  . [place] LIMITED JOINT SPACE STRUCTURES UP BILAT(NO LINKED CHARGES)    Order Specific Question:   Reason for Exam (SYMPTOM  OR DIAGNOSIS REQUIRED)    Answer:   B wrist pain    Order Specific Question:   Preferred imaging location?    Answer:   [facility]   No orders of the defined types were placed in this encounter.   Discussed warning signs or symptoms. Please see discharge instructions. Patient expresses understanding.   The above documentation has been reviewed and is accurate and complete Jackson [name]

## 2019-12-31 NOTE — Patient Instructions (Signed)
Thank you for coming in today. 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  The numb tingly is Cubital tunnel Syndrome.  Do the exercises.  Try OTC Voltaren gel up to 4x daily.   If not improving in a few weeks let me know. Next step is probably injection.    Extensor Carpi Ulnaris Tendinitis Rehab Ask your health care provider which exercises are safe for you. Do exercises exactly as told by your health care provider and adjust them as directed. It is normal to feel mild stretching, pulling, tightness, or discomfort as you do these exercises. Stop right away if you feel sudden pain or your pain gets worse. Do not begin these exercises until told by your health care provider. Stretching and range-of-motion exercises These exercises warm up your muscles and joints and improve the movement and flexibility of your forearm. The exercises also help to relieve pain, numbness, and tingling. Wrist extension 1. Stand or sit with your left / right elbow bent to a 90-degree angle (right angle) at your side, with your palm facing the floor. 2. Slowly lift your wrist up so that your fingers move toward the ceiling (extension), stopping when you feel a gentle stretch on the palm side of your forearm. 3. Hold this position for __________ seconds. 4. Return to the starting position. Repeat __________ times. Complete this exercise __________ times a day. Wrist flexion 1. Stand or sit with your left / right elbow bent to a 90-degree angle (right angle) at your side, with your palm facing the floor. 2. Slowly bend your wrist so that your fingers move toward the floor, stopping when you feel a gentle stretch over the back of your forearm. 3. Hold this position for __________ seconds. 4. Return to the starting position. Repeat __________ times. Complete this exercise __________ times a day. Forearm rotation, supination 1. Stand or sit with your left / right elbow bent to a 90-degree angle  (right angle) at your side. Position your forearm so that the thumb is facing the ceiling (neutral position). 2. Turn (rotate) your palm up toward the ceiling (supination), stopping when you feel a gentle stretch. 3. Hold this position for __________ seconds. 4. Return to the starting position. Repeat __________ times. Complete this exercise __________ times a day. Forearm rotation, pronation 1. Stand or sit with your left / right elbow bent to a 90-degree angle (right angle) at your side. Position your forearm so that the thumb is facing the ceiling (neutral position). 2. Turn (rotate) your palm down toward the floor (pronation), stopping when you feel a gentle stretch. 3. Hold this position for __________ seconds. 4. Return to the starting position. Repeat __________ times. Complete this exercise __________ times a day. Wrist extension, assisted 1. Extend your left / right arm in front of you, and point your fingers downward. 2. With your uninjured hand, gently press the back of your left / right hand toward you until you feel a gentle stretch on the top of your forearm and wrist (extension). 3. Hold this position for __________ seconds. 4. Slowly return to the starting position. Repeat __________ times with your elbow straight and __________ times with your elbow bent. Complete this exercises __________ times a day. Wrist flexion, assisted  1. Stand over a tabletop with your left / right hand resting palm-up on the tabletop and with your fingers pointing away from your body. Your arm should be extended, and there should be a slight bend in your  elbow. 2. Gently press the back of your hand down onto the table by straightening your elbow (flexion stretch). You should feel a stretch in the top of your forearm. 3. Hold this position for __________ seconds. 4. Slowly return to the starting position. Repeat __________ times. Complete this exercise __________ times a day. Strengthening  exercises These exercises build strength and endurance in your forearm. Endurance is the ability to use your muscles for a long time, even after they get tired. Wrist extension 1. Sit with your left / right forearm supported on a table and your hand resting palm-down over the edge of the table. 2. Hold a __________ weight in your left / right hand, or hold a rubber exercise band or tube in both hands. If you are holding a band or tube, take up any slack with your other hand so there is a slight tension in the exercise band or tube when you start. 3. Slowly lift your wrist up toward the ceiling (extension). 4. Hold this position for __________ seconds. 5. Slowly lower your hand to the starting position. Repeat __________ times. Complete this exercise __________ times a day. Ulnar deviation 1. Sit with your left / right forearm supported on a table. Your thumb should be pointing upward, and your hand should be able to move down over the table edge. 2. Hold your left / right arm in front of you and hold a rubber exercise band or tube between your hands. There should be a slight tension in the exercise band or tube when you start. 3. Move your wrist so that your pinkie travels toward the floor. Try to only move your wrist and keep the rest of your arm still. 4. Hold this position for __________ seconds. 5. Slowly return your wrist to the starting position. Repeat __________ times. Complete this exercise __________ times a day. Ulnar deviation, eccentric 1. Sit with your left / right forearm supported on a table. Your thumb should be pointing upward, and your hand should be able to move down over the table edge. 2. Hold your uninjured arm in front of you and over your left / right hand. Hold a rubber exercise band or tube between your hands. Do not put any tension on the exercise band or tube yet. 3. Move your left / right wrist so that your pinkie travels toward the floor. 4. Add more tension to the  band or tube by pulling it upward with your uninjured hand. 5. Hold this position for __________ seconds. 6. Slowly return to the starting position, controlling the speed with your left / right hand and wrist (eccentric). Your hand will move toward the ceiling, thumb first. Try to move only your hand and wrist and keep the rest of your arm still. Repeat __________ times. Complete this exercise __________ times a day. Wrist extension, eccentric 1. Sit with your left / right forearm supported on a table. Your palm should be facing the floor, and your hand should be able to move down over the table edge. 2. Hold a __________ weight in the palm of your left / right hand. 3. Using your uninjured hand, lift your left / right wrist up toward the ceiling. 4. Release your left / right hand and begin to very slowly lower it toward the floor (eccentric). 5. When you have gone as far as you can, use your uninjured hand to lift the left / right wrist up toward the ceiling again. Repeat __________ times. Complete this exercise __________  times a day. This information is not intended to replace advice given to you by your health care provider. Make sure you discuss any questions you have with your health care provider. Document Revised: 01/23/2019 Document Reviewed: 10/18/2018 Elsevier Patient Education  2020 [employer] Inc.    Cubital Tunnel Syndrome  Cubital tunnel syndrome is a condition that causes pain and weakness of the forearm and hand. It happens when one of the nerves that runs along the inside of the elbow joint (ulnar nerve) becomes irritated. This condition is usually caused by repeated arm motions that are done during sports or work-related activities. What are the causes? This condition may be caused by:  Increased pressure on the ulnar nerve at the elbow, arm, or forearm. This can result from: ? Irritation caused by repeated elbow bending. ? Poorly healed elbow fractures. ? Tumors in the  elbow. These are usually noncancerous (benign). ? Scar tissue that develops in the elbow after an injury. ? Bony growths (spurs) near the ulnar nerve.  Stretching of the nerve due to loose elbow ligaments.  Trauma to the nerve at the elbow. What increases the risk? The following factors may make you more likely to develop this condition:  Doing manual labor that requires frequent bending of the elbow.  Playing sports that include repeated or strenuous throwing motions, such as baseball.  Playing contact sports, such as football or lacrosse.  Not warming up properly before activities.  Having diabetes.  Having an underactive thyroid (hypothyroidism). What are the signs or symptoms? Symptoms of this condition include:  Clumsiness or weakness of the hand.  Tenderness of the inner elbow.  Aching or soreness of the inner elbow, forearm, or fingers, especially the little finger or the ring finger.  Increased pain when forcing the elbow to bend.  Reduced control when throwing objects.  Tingling, numbness, or a burning feeling inside the forearm or in part of the hand or fingers, especially the little finger or the ring finger.  Sharp pains that shoot from the elbow down to the wrist and hand.  The inability to grip or pinch hard. How is this diagnosed? This condition is diagnosed based on:  Your symptoms and medical history. Your health care provider will also ask for details about any injury.  A physical exam. You may also have tests, including:  Electromyogram (EMG). This test measures electrical signals sent by your nerves into the muscles.  Nerve conduction study. This test measures how well electrical signals pass through your nerves.  Imaging tests, such as X-rays, ultrasound, and MRI. These tests check for possible causes of your condition. How is this treated? This condition may be treated by:  Stopping the activities that are causing your symptoms to get  worse.  Icing and taking medicines to reduce pain and swelling.  Wearing a splint to prevent your elbow from bending, or wearing an elbow pad where the ulnar nerve is closest to the skin.  Working with a physical therapist in less severe cases. This may help to: ? Decrease your symptoms. ? Improve the strength and range of motion of your elbow, forearm, and hand. If these treatments do not help, surgery may be needed. Follow these instructions at home: If you have a splint:  Wear the splint as told by your health care provider. Remove it only as told by your health care provider.  Loosen the splint if your fingers tingle, become numb, or turn cold and blue.  Keep the splint clean.  If the splint is not waterproof: ? Do not let it get wet. ? Cover it with a watertight covering when you take a bath or shower. Managing pain, stiffness, and swelling   If directed, put ice on the injured area: ? Put ice in a plastic bag. ? Place a towel between your skin and the bag. ? Leave the ice on for 20 minutes, 2-3 times a day.  Move your fingers often to avoid stiffness and to lessen swelling.  Raise (elevate) the injured area above the level of your heart while you are sitting or lying down. General instructions  Take over-the-counter and prescription medicines only as told by your health care provider.  Do any exercise or physical therapy as told by your health care provider.  Do not drive or use heavy machinery while taking prescription pain medicine.  If you were given an elbow pad, wear it as told by your health care provider.  Keep all follow-up visits as told by your health care provider. This is important. Contact a health care provider if:  Your symptoms get worse.  Your symptoms do not get better with treatment.  You have new pain.  Your hand on the injured side feels numb or cold. Summary  Cubital tunnel syndrome is a condition that causes pain and weakness of the  forearm and hand.  You are more likely to develop this condition if you do work or play sports that involve repeated arm movements.  This condition is often treated by stopping repetitive activities, applying ice, and using anti-inflammatory medicines.  In rare cases, surgery may be needed. This information is not intended to replace advice given to you by your health care provider. Make sure you discuss any questions you have with your health care provider. Document Revised: 02/13/2018 Document Reviewed: 02/13/2018 Elsevier Patient Education  2020 [employer].

## 2020-01-24 ENCOUNTER — Ambulatory Visit: Payer: Medicaid Other

## 2020-01-25 ENCOUNTER — Ambulatory Visit: Payer: Medicaid Other

## 2020-02-01 ENCOUNTER — Ambulatory Visit: Payer: Medicaid Other | Attending: Internal Medicine

## 2020-02-01 ENCOUNTER — Ambulatory Visit: Payer: Medicaid Other

## 2020-02-01 DIAGNOSIS — Z23 Encounter for immunization: Secondary | ICD-10-CM

## 2020-02-01 NOTE — Progress Notes (Signed)
   Covid-19 Vaccination Clinic  Name:  Jackson [name]    MRN: [id number] DOB: 02-20-1981  02/01/2020  Jackson [name] was observed post Covid-19 immunization for 15 minutes without incident. He was provided with Vaccine Information Sheet and instruction to access the V-Safe system.   Jackson [name] was instructed to call 911 with any severe reactions post vaccine: Marland Kitchen Difficulty breathing  . Swelling of face and throat  . A fast heartbeat  . A bad rash all over body  . Dizziness and weakness   Immunizations Administered    Name Date Dose VIS Date Route   Moderna COVID-19 Vaccine 02/01/2020 10:49 AM 0.5 mL 09/2019 Intramuscular   Manufacturer: Moderna   Lot: [id number]   NDC: 71219-758-83

## 2020-03-04 ENCOUNTER — Other Ambulatory Visit: Payer: Self-pay

## 2020-03-04 ENCOUNTER — Ambulatory Visit: Payer: Medicaid Other | Attending: Internal Medicine

## 2020-03-04 DIAGNOSIS — Z23 Encounter for immunization: Secondary | ICD-10-CM

## 2020-03-04 NOTE — Progress Notes (Signed)
   Covid-19 Vaccination Clinic  Name:  Jackson [name]    MRN: [id number] DOB: 12/23/1980  03/04/2020  Jackson [name] was observed post Covid-19 immunization for 15 minutes without incident. He was provided with Vaccine Information Sheet and instruction to access the V-Safe system.   Jackson [name] was instructed to call 911 with any severe reactions post vaccine: Marland Kitchen Difficulty breathing  . Swelling of face and throat  . A fast heartbeat  . A bad rash all over body  . Dizziness and weakness   Immunizations Administered    Name Date Dose VIS Date Route   Moderna COVID-19 Vaccine 03/04/2020 10:56 AM 0.5 mL 09/2019 Intramuscular   Manufacturer: Moderna   Lot: [id number]   NDC: 16606-301-60

## 2023-01-14 DIAGNOSIS — K580 Irritable bowel syndrome with diarrhea: Secondary | ICD-10-CM | POA: Diagnosis not present

## 2023-01-14 DIAGNOSIS — K625 Hemorrhage of anus and rectum: Secondary | ICD-10-CM | POA: Diagnosis not present

## 2023-01-14 DIAGNOSIS — Q859 Phakomatosis, unspecified: Secondary | ICD-10-CM | POA: Diagnosis not present

## 2023-01-14 DIAGNOSIS — Z86010 Personal history of colonic polyps: Secondary | ICD-10-CM | POA: Diagnosis not present

## 2023-01-17 ENCOUNTER — Other Ambulatory Visit: Payer: Self-pay | Admitting: Gastroenterology

## 2023-01-17 DIAGNOSIS — K625 Hemorrhage of anus and rectum: Secondary | ICD-10-CM

## 2023-01-17 DIAGNOSIS — Q859 Phakomatosis, unspecified: Secondary | ICD-10-CM

## 2023-01-19 DIAGNOSIS — K580 Irritable bowel syndrome with diarrhea: Secondary | ICD-10-CM | POA: Diagnosis not present

## 2023-02-24 ENCOUNTER — Ambulatory Visit
Admission: RE | Admit: 2023-02-24 | Discharge: 2023-02-24 | Disposition: A | Discharge status: Home / Self Care | Payer: 59 | Source: Ambulatory Visit | Attending: Gastroenterology | Admitting: Gastroenterology

## 2023-02-24 DIAGNOSIS — Q859 Phakomatosis, unspecified: Secondary | ICD-10-CM

## 2023-02-24 DIAGNOSIS — Z9049 Acquired absence of other specified parts of digestive tract: Secondary | ICD-10-CM | POA: Diagnosis not present

## 2023-02-24 DIAGNOSIS — K625 Hemorrhage of anus and rectum: Secondary | ICD-10-CM

## 2023-02-24 DIAGNOSIS — R630 Anorexia: Secondary | ICD-10-CM | POA: Diagnosis not present

## 2023-02-24 MED ORDER — IOPAMIDOL (ISOVUE-300) INJECTION 61%
80.0000 mL | Freq: Once | INTRAVENOUS | Status: AC | PRN
  Administered 2023-02-24: 80 mL via INTRAVENOUS

## 2023-04-02 DIAGNOSIS — S65311A Laceration of deep palmar arch of right hand, initial encounter: Secondary | ICD-10-CM | POA: Diagnosis not present

## 2023-04-02 DIAGNOSIS — S61411A Laceration without foreign body of right hand, initial encounter: Secondary | ICD-10-CM | POA: Diagnosis not present

## 2023-04-02 DIAGNOSIS — S61511A Laceration without foreign body of right wrist, initial encounter: Secondary | ICD-10-CM | POA: Diagnosis not present

## 2023-04-02 DIAGNOSIS — Z23 Encounter for immunization: Secondary | ICD-10-CM | POA: Diagnosis not present

## 2023-04-08 DIAGNOSIS — Z5189 Encounter for other specified aftercare: Secondary | ICD-10-CM | POA: Diagnosis not present

## 2023-07-13 ENCOUNTER — Ambulatory Visit (HOSPITAL_COMMUNITY)
Admission: EM | Admit: 2023-07-13 | Discharge: 2023-07-14 | Disposition: A | Discharge status: Other Acute Inpatient Hospital | Payer: MEDICAID | Attending: Psychiatry | Admitting: Psychiatry

## 2023-07-13 ENCOUNTER — Other Ambulatory Visit: Payer: Self-pay

## 2023-07-13 DIAGNOSIS — F919 Conduct disorder, unspecified: Secondary | ICD-10-CM | POA: Insufficient documentation

## 2023-07-13 DIAGNOSIS — R45851 Suicidal ideations: Secondary | ICD-10-CM | POA: Insufficient documentation

## 2023-07-13 DIAGNOSIS — F39 Unspecified mood [affective] disorder: Secondary | ICD-10-CM | POA: Diagnosis present

## 2023-07-13 DIAGNOSIS — F1210 Cannabis abuse, uncomplicated: Secondary | ICD-10-CM | POA: Insufficient documentation

## 2023-07-13 DIAGNOSIS — Z9151 Personal history of suicidal behavior: Secondary | ICD-10-CM | POA: Insufficient documentation

## 2023-07-13 DIAGNOSIS — F911 Conduct disorder, childhood-onset type: Secondary | ICD-10-CM

## 2023-07-13 DIAGNOSIS — F4310 Post-traumatic stress disorder, unspecified: Secondary | ICD-10-CM | POA: Insufficient documentation

## 2023-07-13 LAB — CBC WITH DIFFERENTIAL/PLATELET
Abs Immature Granulocytes: 0.07 10*3/uL (ref 0.00–0.07)
Basophils Absolute: 0 10*3/uL (ref 0.0–0.1)
Basophils Relative: 0 %
Eosinophils Absolute: 0 10*3/uL (ref 0.0–0.5)
Eosinophils Relative: 0 %
HCT: 43.6 % (ref 39.0–52.0)
Hemoglobin: 15.7 g/dL (ref 13.0–17.0)
Immature Granulocytes: 1 %
Lymphocytes Relative: 6 %
Lymphs Abs: 0.9 10*3/uL (ref 0.7–4.0)
MCH: 31.9 pg (ref 26.0–34.0)
MCHC: 36 g/dL (ref 30.0–36.0)
MCV: 88.6 fL (ref 80.0–100.0)
Monocytes Absolute: 0.5 10*3/uL (ref 0.1–1.0)
Monocytes Relative: 3 %
Neutro Abs: 13.2 10*3/uL — ABNORMAL HIGH (ref 1.7–7.7)
Neutrophils Relative %: 90 %
Platelets: 212 10*3/uL (ref 150–400)
RBC: 4.92 MIL/uL (ref 4.22–5.81)
RDW: 12.6 % (ref 11.5–15.5)
WBC: 14.6 10*3/uL — ABNORMAL HIGH (ref 4.0–10.5)
nRBC: 0 % (ref 0.0–0.2)

## 2023-07-13 LAB — ETHANOL: Alcohol, Ethyl (B): <10 mg/dL (ref <10)

## 2023-07-13 LAB — POCT URINE DRUG SCREEN - MANUAL ENTRY (I-SCREEN)
POC Amphetamine UR: NOT DETECTED
POC Buprenorphine (BUP): NOT DETECTED
POC Cocaine UR: NOT DETECTED
POC Marijuana UR: POSITIVE — AB
POC Methadone UR: NOT DETECTED
POC Methamphetamine UR: NOT DETECTED
POC Morphine: NOT DETECTED
POC Oxazepam (BZO): NOT DETECTED
POC Oxycodone UR: NOT DETECTED
POC Secobarbital (BAR): NOT DETECTED

## 2023-07-13 LAB — COMPREHENSIVE METABOLIC PANEL
ALT: 25 U/L (ref 0–44)
AST: 24 U/L (ref 15–41)
Albumin: 4.6 g/dL (ref 3.5–5.0)
Alkaline Phosphatase: 57 U/L (ref 38–126)
Anion gap: 13 (ref 5–15)
BUN: 8 mg/dL (ref 6–20)
CO2: 25 mmol/L (ref 22–32)
Calcium: 9.5 mg/dL (ref 8.9–10.3)
Chloride: 102 mmol/L (ref 98–111)
Creatinine, Ser: 0.98 mg/dL (ref 0.61–1.24)
GFR, Estimated: >60 mL/min (ref >60)
Glucose, Bld: 95 mg/dL (ref 70–99)
Potassium: 3.6 mmol/L (ref 3.5–5.1)
Sodium: 140 mmol/L (ref 135–145)
Total Bilirubin: 0.8 mg/dL (ref 0.3–1.2)
Total Protein: 6.8 g/dL (ref 6.5–8.1)

## 2023-07-13 LAB — TSH: TSH: 1.411 u[IU]/mL (ref 0.350–4.500)

## 2023-07-13 MED ORDER — ZIPRASIDONE MESYLATE 20 MG IM SOLR: 20.0000 mg | INTRAMUSCULAR | Status: DC | PRN

## 2023-07-13 MED ORDER — ALUM & MAG HYDROXIDE-SIMETH 200-200-20 MG/5ML PO SUSP: 30.0000 mL | ORAL | Status: DC | PRN

## 2023-07-13 MED ORDER — ACETAMINOPHEN 325 MG PO TABS
650.0000 mg | ORAL_TABLET | Freq: Four times a day (QID) | ORAL | Status: DC | PRN
  Administered 2023-07-14: 650 mg via ORAL
  Filled 2023-07-13: qty 2

## 2023-07-13 MED ORDER — MAGNESIUM HYDROXIDE 400 MG/5ML PO SUSP: 30.0000 mL | Freq: Every day | ORAL | Status: DC | PRN

## 2023-07-13 MED ORDER — LORAZEPAM 1 MG PO TABS
1.0000 mg | ORAL_TABLET | ORAL | Status: DC | PRN
  Filled 2023-07-13: qty 1

## 2023-07-13 MED ORDER — OLANZAPINE 10 MG PO TBDP: 10.0000 mg | ORAL_TABLET | Freq: Three times a day (TID) | ORAL | Status: DC | PRN

## 2023-07-13 NOTE — ED Provider Notes (Signed)
[facility] Continuous Assessment Admission H&P  Date: 07/13/23 Patient Name: Jackson [name] MRN: [id number] Chief Complaint: under IVC for suicidal ideation and aggression   Diagnoses:  Final diagnoses:  Suicidal ideation  Unsocialized aggression  Marijuana abuse    HPI: Jackson [name] "Jackson [name]", 42 y/o male with a history of Suicidal ideations, PTSD and aggressive behavior,  present to [facility]  via [employer] under IVC.  Per the IVC Jackson [name] is Non-binary and has taken on the name "Jackson [name]", They" pronouns.  Jackson [name] have been diagnosed with PTSD.  Jackson [name] have made 3 suicide attempts last year.  Most frequently, the Jackson [name] has expressed wanting to die and has a plan to go through with the suicide.  Jackson [name] has begun to get financial and a wheel prepared.  Jackson [name] is aggressive verbally, Jackson [name] has broken their phone and has stated that there is no reason for Jackson [name] to get another one.  Jackson [name] is not a drinker but when they have suicidal thoughts, they drink a lot of alcohol.  Jackson [name] also smoked marijuana.  A face-to-face observation of the patient patient stated he does struggle with suicidal tendencies so he called his friend to come spend time with him today and he fell asleep and heard a knock on the door and there was the police.  According to patient he does not need to be here and he does not currently lives with his wife because he moved out 2 months ago.  According to patient he smoked marijuana on a daily basis, patient is currently part-time employed.  Per the patient he is not taking any medication, he is currently not seeing a psychiatrist or therapist.  Face evaluation of patient, he is alert and oriented x 4, speech is clear but very blunt at times.  Patient mood is very angry affect is flat congruent with mood.  Patient can become very angry easily.  Patient currently denies that he is suicidal but stated that he had suicide thoughts today but would not  elaborate on what his plans are.  Patient denies HI, AVH or paranoia.  Patient does not seem to be forthcoming with information.  During the interview patient started to do the blame game on his wife and his family.  Patient does seem to be a risk to himself.    Therefore recommend inpatient admission when a bed becomes available at [facility]  Total Time spent with patient: 30 minutes  Musculoskeletal  Strength & Muscle Tone: within normal limits Gait & Station: normal Patient leans: N/A  Psychiatric Specialty Exam  Presentation General Appearance:  Casual  Eye Contact: Good  Speech: Clear and Coherent  Speech Volume: Normal  Handedness: Right   Mood and Affect  Mood: Angry; Anxious  Affect: Flat; Blunt   Thought Process  Thought Processes: Linear  Descriptions of Associations:Circumstantial  Orientation:Full (Time, Place and Person)  Thought Content:WDL    Hallucinations:Hallucinations: None  Ideas of Reference:None  Suicidal Thoughts:Suicidal Thoughts: Yes, Passive SI Passive Intent and/or Plan: -- (pt did want to talk about it)  Homicidal Thoughts:Homicidal Thoughts: No   Sensorium  Memory: Immediate Good  Judgment: Poor  Insight: Fair   Art therapist  Concentration: Fair  Attention Span: Good  Recall: Good  Fund of Knowledge: Good  Language: Good   Psychomotor Activity  Psychomotor Activity: Psychomotor Activity: Normal   Assets  Assets: Desire for Improvement; Resilience   Sleep  Sleep: Sleep: Fair Number of Hours of Sleep: 6   Nutritional Assessment (For OBS and  FBC admissions only) Has the patient had a weight loss or gain of 10 pounds or more in the last 3 months?: No Has the patient had a decrease in food intake/or appetite?: No Does the patient have dental problems?: No Does the patient have eating habits or behaviors that may be indicators of an eating disorder including binging or inducing vomiting?:  No Has the patient recently lost weight without trying?: 0 Has the patient been eating poorly because of a decreased appetite?: 0 Malnutrition Screening Tool Score: 0    Physical Exam HENT:     Head: Normocephalic.     Nose: Nose normal.  Eyes:     Pupils: Pupils are equal, round, and reactive to light.  Cardiovascular:     Rate and Rhythm: Normal rate.  Pulmonary:     Effort: Pulmonary effort is normal.  Musculoskeletal:        General: Normal range of motion.     Cervical back: Normal range of motion.  Neurological:     General: No focal deficit present.     Mental Status: Jackson [name] is alert.  Psychiatric:        Mood and Affect: Mood normal.        Behavior: Behavior normal.        Thought Content: Thought content normal.        Judgment: Judgment normal.    Review of Systems  Constitutional: Negative.   HENT: Negative.    Eyes: Negative.   Respiratory: Negative.    Cardiovascular: Negative.   Gastrointestinal: Negative.   Genitourinary: Negative.   Musculoskeletal: Negative.   Skin: Negative.   Neurological: Negative.   Psychiatric/Behavioral:  Positive for substance abuse and suicidal ideas. The patient is nervous/anxious.     There were no vitals taken for this visit. There is no height or weight on file to calculate BMI.  Past Psychiatric History: PTSD , SI   Is the patient at risk to self? Yes  Has the patient been a risk to self in the past 6 months? Yes .    Has the patient been a risk to self within the distant past? Yes   Is the patient a risk to others? Yes   Has the patient been a risk to others in the past 6 months? No   Has the patient been a risk to others within the distant past? No   Past Medical History: see chart   Family History: unknown   Social History: marijuana   Last Labs:  No visits with results within 6 Month(s) from this visit.  Latest known visit with results is:  Office Visit on 09/18/2018  Component Date Value Ref Range  Status   Color, UA 09/18/2018 yellow  yellow Final   Clarity, UA 09/18/2018 clear  clear Final   Glucose, UA 09/18/2018 negative  negative mg/dL Final   Bilirubin, UA 16/07/9603 negative  negative Final   Ketones, POC UA 09/18/2018 negative  negative mg/dL Final   Spec Grav, UA 54/06/8118 1.010  1.010 - 1.025 Final   Blood, UA 09/18/2018 negative  negative Final   pH, UA 09/18/2018 7.0  5.0 - 8.0 Final   Protein Ur, POC 09/18/2018 negative  negative mg/dL Final   Urobilinogen, UA 09/18/2018 0.2  0.2 or 1.0 E.U./dL Final   Nitrite, UA 14/78/2956 Negative  Negative Final   Leukocytes, UA 09/18/2018 Negative  Negative Final   Lithium Lvl 09/18/2018 0.1 (L)  0.6 - 1.2 mmol/L Final   Comment:  Detection Limit = 0.1                           <0.1 indicates None Detected     Allergies: Patient has no known allergies.  Medications:     Medical Decision Making  Inpatient observation,  recommend inpatient admission when a bed become available at [facility]  Lab Orders         CBC with Differential/Platelet         Comprehensive metabolic panel         Ethanol         TSH         POCT Urine Drug Screen - (I-Screen)       Meds ordered this encounter  Medications   acetaminophen (TYLENOL) tablet 650 mg   alum & mag hydroxide-simeth (MAALOX/MYLANTA) 200-200-20 MG/5ML suspension 30 mL   magnesium hydroxide (MILK OF MAGNESIA) suspension 30 mL   AND Linked Order Group    OLANZapine zydis (ZYPREXA) disintegrating tablet 10 mg    LORazepam (ATIVAN) tablet 1 mg    ziprasidone (GEODON) injection 20 mg     Recommendations  Based on my evaluation the patient does not appear to have an emergency medical condition.  Sindy Guadeloupe, NP 07/13/23  8:56 PM
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
## 2023-07-13 NOTE — ED Notes (Signed)
Pt under IVC, presents with passive  suicidal thoughts, no plan noted, but spouse reports pt has been making financial arrangements and a will.  Pt initially anxious and irritable, but calmed down and is more pleasant.  Comfort measures given.  Monitoring for safety.

## 2023-07-13 NOTE — Progress Notes (Signed)
   07/13/23 2016  [facility] Triage Screening (Walk-ins at [facility] only)  How Did You Hear About [place]? Legal System  What Is the Reason for Your Visit/Call Today? Pt presents to [facility] under IVC, accompanied by law enforcement. Pt immediately reports that he is angry and was blindsided this evening by police showing up at his home. Pt reports issues with his spouse and he refused to reach out to her for a few days and out of anger he said " I hate you and I want to die". Pt reports that he had plans to be with friends to help him through his moment of crisis. Per IVC-" [name] is non-binary and has taken on the "them, "they" pronouns. [name] has been diagnosed with PTSD. [name] has made three suicide attempts within the last year. More recent, the respondent expressed wanting to die and has a plan to go through with the suicide. [name] has begun to get financials and a will prepared. [name] is aggressive verbally. [name] has broken their phone and has stated that there is no reason for them to get another one. [name] is not a drinker but when they have suicidal ideations, they drink a lot of alcohol. [name] also smokes marijuana". Pt currently denies SI,HI,AVH.  How Long Has This Been Causing You Problems? <Week  Have You Recently Had Any Thoughts About Hurting Yourself? Yes  How long ago did you have thoughts about hurting yourself? yesterday  Are You Planning to Commit Suicide/Harm Yourself At This time? No  Have you Recently Had Thoughts About Hurting Someone Jackson [name]? No  Are You Planning To Harm Someone At This Time? No  Are you currently experiencing any auditory, visual or other hallucinations? No  Have You Used Any Alcohol or Drugs in the Past 24 Hours? No  Do you have any current medical co-morbidities that require immediate attention? No  Clinician description of patient physical appearance/behavior: Pt is cooperative, calm  What Do You Feel Would Help You the Most Today?  Treatment for Depression or other mood problem  If access to [facility] was not available, would you have sought care in the Emergency Department? No  Determination of Need Urgent (48 hours)  Options For Referral Other: Comment;Outpatient Therapy;Medication Management;BH Urgent Care

## 2023-07-13 NOTE — ED Notes (Signed)
Pt sleeping at present, no distress noted.  Monitoring for safety. 

## 2023-07-13 NOTE — ED Notes (Signed)
Pt sleeping@this time breathing even and unlabored will continue to monitor for safety 

## 2023-07-14 ENCOUNTER — Inpatient Hospital Stay (HOSPITAL_COMMUNITY)
Admission: AD | Admit: 2023-07-14 | Discharge: 2023-07-20 | DRG: 885 | Disposition: A | Discharge status: Home / Self Care | Payer: MEDICAID | Source: Intra-hospital | Attending: Psychiatry | Admitting: Psychiatry

## 2023-07-14 ENCOUNTER — Encounter (HOSPITAL_COMMUNITY): Payer: Self-pay | Admitting: Behavioral Health

## 2023-07-14 DIAGNOSIS — Z9151 Personal history of suicidal behavior: Secondary | ICD-10-CM

## 2023-07-14 DIAGNOSIS — R45851 Suicidal ideations: Secondary | ICD-10-CM | POA: Diagnosis present

## 2023-07-14 DIAGNOSIS — Z8249 Family history of ischemic heart disease and other diseases of the circulatory system: Secondary | ICD-10-CM | POA: Diagnosis not present

## 2023-07-14 DIAGNOSIS — F4310 Post-traumatic stress disorder, unspecified: Secondary | ICD-10-CM | POA: Diagnosis present

## 2023-07-14 DIAGNOSIS — F39 Unspecified mood [affective] disorder: Secondary | ICD-10-CM | POA: Diagnosis present

## 2023-07-14 DIAGNOSIS — F640 Transsexualism: Secondary | ICD-10-CM | POA: Diagnosis present

## 2023-07-14 DIAGNOSIS — Z79899 Other long term (current) drug therapy: Secondary | ICD-10-CM

## 2023-07-14 DIAGNOSIS — Z87828 Personal history of other (healed) physical injury and trauma: Secondary | ICD-10-CM | POA: Diagnosis not present

## 2023-07-14 DIAGNOSIS — F332 Major depressive disorder, recurrent severe without psychotic features: Principal | ICD-10-CM | POA: Diagnosis present

## 2023-07-14 DIAGNOSIS — F411 Generalized anxiety disorder: Secondary | ICD-10-CM | POA: Diagnosis present

## 2023-07-14 DIAGNOSIS — Z635 Disruption of family by separation and divorce: Secondary | ICD-10-CM | POA: Diagnosis not present

## 2023-07-14 DIAGNOSIS — G8929 Other chronic pain: Secondary | ICD-10-CM | POA: Diagnosis present

## 2023-07-14 DIAGNOSIS — K589 Irritable bowel syndrome without diarrhea: Secondary | ICD-10-CM | POA: Diagnosis present

## 2023-07-14 DIAGNOSIS — M797 Fibromyalgia: Secondary | ICD-10-CM | POA: Diagnosis present

## 2023-07-14 DIAGNOSIS — F410 Panic disorder [episodic paroxysmal anxiety] without agoraphobia: Secondary | ICD-10-CM | POA: Diagnosis present

## 2023-07-14 MED ORDER — ACETAMINOPHEN 325 MG PO TABS
650.0000 mg | ORAL_TABLET | Freq: Four times a day (QID) | ORAL | Status: DC | PRN
  Administered 2023-07-15 – 2023-07-16 (×2): 650 mg via ORAL
  Filled 2023-07-14 (×2): qty 2

## 2023-07-14 MED ORDER — DIPHENHYDRAMINE HCL 50 MG/ML IJ SOLN: 50.0000 mg | Freq: Three times a day (TID) | INTRAMUSCULAR | Status: DC | PRN

## 2023-07-14 MED ORDER — LORAZEPAM 2 MG/ML IJ SOLN: 2.0000 mg | Freq: Three times a day (TID) | INTRAMUSCULAR | Status: DC | PRN

## 2023-07-14 MED ORDER — TRAZODONE HCL 50 MG PO TABS: 50.0000 mg | ORAL_TABLET | Freq: Every evening | ORAL | Status: DC | PRN

## 2023-07-14 MED ORDER — MAGNESIUM HYDROXIDE 400 MG/5ML PO SUSP: 30.0000 mL | Freq: Every day | ORAL | Status: DC | PRN

## 2023-07-14 MED ORDER — DIPHENHYDRAMINE HCL 25 MG PO CAPS: 50.0000 mg | ORAL_CAPSULE | Freq: Three times a day (TID) | ORAL | Status: DC | PRN

## 2023-07-14 MED ORDER — HALOPERIDOL LACTATE 5 MG/ML IJ SOLN: 5.0000 mg | Freq: Three times a day (TID) | INTRAMUSCULAR | Status: DC | PRN

## 2023-07-14 MED ORDER — DIPHENHYDRAMINE HCL 50 MG PO CAPS: 50.0000 mg | ORAL_CAPSULE | Freq: Three times a day (TID) | ORAL | Status: DC | PRN

## 2023-07-14 MED ORDER — HYDROXYZINE HCL 25 MG PO TABS: 25.0000 mg | ORAL_TABLET | Freq: Three times a day (TID) | ORAL | Status: DC | PRN

## 2023-07-14 MED ORDER — ACETAMINOPHEN 325 MG PO TABS: 650.0000 mg | ORAL_TABLET | Freq: Four times a day (QID) | ORAL | Status: DC | PRN

## 2023-07-14 MED ORDER — ALUM & MAG HYDROXIDE-SIMETH 200-200-20 MG/5ML PO SUSP: 30.0000 mL | ORAL | Status: DC | PRN

## 2023-07-14 MED ORDER — LORAZEPAM 1 MG PO TABS: 2.0000 mg | ORAL_TABLET | Freq: Three times a day (TID) | ORAL | Status: DC | PRN

## 2023-07-14 MED ORDER — HYDROXYZINE HCL 25 MG PO TABS
25.0000 mg | ORAL_TABLET | Freq: Three times a day (TID) | ORAL | Status: DC | PRN
  Administered 2023-07-15: 25 mg via ORAL
  Filled 2023-07-14 (×4): qty 1

## 2023-07-14 MED ORDER — TRAZODONE HCL 50 MG PO TABS
50.0000 mg | ORAL_TABLET | Freq: Every evening | ORAL | Status: DC | PRN
  Administered 2023-07-16 – 2023-07-19 (×4): 50 mg via ORAL
  Filled 2023-07-14 (×4): qty 1

## 2023-07-14 MED ORDER — HALOPERIDOL 5 MG PO TABS: 5.0000 mg | ORAL_TABLET | Freq: Three times a day (TID) | ORAL | Status: DC | PRN

## 2023-07-14 NOTE — Group Note (Signed)
LCSW Group Therapy Note   Group Date: 07/14/2023 Start Time: 1100 End Time: 1200   Type of Therapy and Topic:  Group Therapy: Boundaries  Participation Level:  Did Not Attend  Description of Group: This group will address the use of boundaries in their personal lives. Patients will explore why boundaries are important, the difference between healthy and unhealthy boundaries, and negative and postive outcomes of different boundaries and will look at how boundaries can be crossed.  Patients will be encouraged to identify current boundaries in their own lives and identify what kind of boundary is being set. Facilitators will guide patients in utilizing problem-solving interventions to address and correct types boundaries being used and to address when no boundary is being used. Understanding and applying boundaries will be explored and addressed for obtaining and maintaining a balanced life. Patients will be encouraged to explore ways to assertively make their boundaries and needs known to significant others in their lives, using other group members and facilitator for role play, support, and feedback.  Therapeutic Goals:  1.  Patient will identify areas in their life where setting clear boundaries could be  used to improve their life.  2.  Patient will identify signs/triggers that a boundary is not being respected. 3.  Patient will identify two ways to set boundaries in order to achieve balance in  their lives: 4.  Patient will demonstrate ability to communicate their needs and set boundaries  through discussion and/or role plays  Summary of Patient Progress:  Did not attend  Therapeutic Modalities:   Cognitive Behavioral Therapy Solution-Focused Therapy  Marinda Elk, LCSWA 07/14/2023  3:04 PM

## 2023-07-14 NOTE — Tx Team (Signed)
Initial Treatment Plan 07/14/2023 2:52 PM Jackson [name] ZOX:[id number]    PATIENT STRESSORS: Financial difficulties   Marital or family conflict     PATIENT STRENGTHS: General fund of knowledge  Supportive family/friends    PATIENT IDENTIFIED PROBLEMS: Mood Disorder                      DISCHARGE CRITERIA:  Need for constant or close observation no longer present Verbal commitment to aftercare and medication compliance  PRELIMINARY DISCHARGE PLAN: Outpatient therapy  PATIENT/FAMILY INVOLVEMENT: This treatment plan has been presented to and reviewed with the patient, Jackson [name].  The patient and family have been given the opportunity to ask questions and make suggestions.  Laurance Flatten, RN 07/14/2023, 2:52 PM
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
## 2023-07-14 NOTE — ED Notes (Signed)
[facility] here at this time to transport pt to [facility].  Pt remains cooperative and calm.  No distress noted. Pt did not have any belongings with them and was escorted to sally  port by staff.  IVC and EMTALA included in packet. Pt left without incident.

## 2023-07-14 NOTE — ED Notes (Signed)
Report called to Engineer, manufacturing systems at [facility].  Verbalized understanding.  [employer] called for transport.

## 2023-07-14 NOTE — ED Notes (Signed)
Cont to wait on [employer] for  transportation

## 2023-07-14 NOTE — Progress Notes (Signed)
   07/14/23 1315  Psych Admission Type (Psych Patients Only)  Admission Status Involuntary  Psychosocial Assessment  Patient Complaints Anger  Eye Contact Fair  Facial Expression Angry  Affect Angry  Speech Pressured  Interaction Demanding;Hostile  Motor Activity Restless  Appearance/Hygiene Unremarkable  Behavior Characteristics Agitated;Guarded;Irritable;Resistant to care  Mood Angry  Thought Process  Coherency WDL  Content WDL  Delusions None reported or observed  Perception WDL  Hallucination None reported or observed  Judgment Poor  Confusion None  Danger to Self  Current suicidal ideation? Denies (denies)  Agreement Not to Harm Self Yes  Description of Agreement verbal  Danger to Others  Danger to Others None reported or observed

## 2023-07-14 NOTE — Progress Notes (Signed)
   07/14/23 2200  Psych Admission Type (Psych Patients Only)  Admission Status Involuntary  Psychosocial Assessment  Patient Complaints Anxiety  Eye Contact Fair  Facial Expression Flat  Affect Anxious  Speech Pressured  Interaction Guarded  Motor Activity Fidgety  Appearance/Hygiene Unremarkable  Behavior Characteristics Guarded  Mood Depressed  Thought Process  Coherency WDL  Content WDL  Delusions None reported or observed  Perception WDL  Hallucination None reported or observed  Judgment Poor  Confusion None  Danger to Self  Current suicidal ideation? Denies  Agreement Not to Harm Self Yes  Description of Agreement verbal  Danger to Others  Danger to Others None reported or observed

## 2023-07-14 NOTE — ED Notes (Signed)
Pt was evaluated by provider.  Became upset and responded loudly and agitated.  Pt reported that "I want to go home"   pt was redirected and is currently  on the phone talking with emergency contact.  Staff will cont to monitor for safety.

## 2023-07-14 NOTE — Progress Notes (Signed)
Pt was accepted to [facility] TODAY10/12/2022; Bed Assignment -[facility] will provide during 1st shift.  Pt meets inpatient criteria per Lavonna Monarch  Attending Physician will be Dr. Phineas Inches, MD  Report can be called to: -Adult unit: [phone]  Pt can arrive after: 9:00am  Care Team notified: Night [facility] Fransico Michael, RN, Beatriz Stallion, LCAS, Rodney Langton, LPN, Hansel Starling, RN    [place], LCSWA 07/14/2023 @ 12:54 AM

## 2023-07-14 NOTE — Discharge Instructions (Addendum)
Transfer to [facility] 

## 2023-07-14 NOTE — ED Notes (Signed)
Pt currently laying in bed.  Reports "I really wish I could of explained my situation better this morning".  Pt acknowledges that they were angry and "frustrated about where I am".  Pt was listened to and reports that they have accepted being under IVC and going to [facility].  Staff will cont to monitor for safety.

## 2023-07-14 NOTE — ED Notes (Signed)
Pt sleeping@this time breathing even and unlabored will continue to monitor for safety 

## 2023-07-15 ENCOUNTER — Encounter (HOSPITAL_COMMUNITY): Payer: Self-pay

## 2023-07-15 DIAGNOSIS — F411 Generalized anxiety disorder: Secondary | ICD-10-CM | POA: Diagnosis present

## 2023-07-15 DIAGNOSIS — F332 Major depressive disorder, recurrent severe without psychotic features: Principal | ICD-10-CM | POA: Diagnosis present

## 2023-07-15 DIAGNOSIS — F39 Unspecified mood [affective] disorder: Secondary | ICD-10-CM

## 2023-07-15 DIAGNOSIS — F4310 Post-traumatic stress disorder, unspecified: Secondary | ICD-10-CM | POA: Diagnosis present

## 2023-07-15 MED ORDER — DULOXETINE HCL 30 MG PO CPEP
30.0000 mg | ORAL_CAPSULE | Freq: Every day | ORAL | Status: DC
  Administered 2023-07-16 – 2023-07-17 (×2): 30 mg via ORAL
  Filled 2023-07-15 (×4): qty 1

## 2023-07-15 MED ORDER — DULOXETINE HCL 20 MG PO CPEP
20.0000 mg | ORAL_CAPSULE | Freq: Every day | ORAL | Status: AC
  Administered 2023-07-15: 20 mg via ORAL
  Filled 2023-07-15 (×2): qty 1
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
## 2023-07-15 NOTE — BH IP Treatment Plan (Signed)
Interdisciplinary Treatment and Diagnostic Plan Update  07/15/2023 Time of Session: 11:00AM Jackson [name] MRN: [id number]  Principal Diagnosis: Mood disorder [facility])  Secondary Diagnoses: Principal Problem:   Mood disorder (HCC)   Current Medications:  Current Facility-Administered Medications  Medication Dose Route Frequency Provider Last Rate Last Admin   acetaminophen (TYLENOL) tablet 650 mg  650 mg Oral Q6H PRN White, Patrice L, NP   650 mg at 07/15/23 0331   alum & mag hydroxide-simeth (MAALOX/MYLANTA) 200-200-20 MG/5ML suspension 30 mL  30 mL Oral Q4H PRN White, Patrice L, NP       diphenhydrAMINE (BENADRYL) capsule 50 mg  50 mg Oral TID PRN White, Patrice L, NP       Or   diphenhydrAMINE (BENADRYL) injection 50 mg  50 mg Intramuscular TID PRN White, Patrice L, NP       haloperidol (HALDOL) tablet 5 mg  5 mg Oral TID PRN White, Patrice L, NP       Or   haloperidol lactate (HALDOL) injection 5 mg  5 mg Intramuscular TID PRN White, Patrice L, NP       hydrOXYzine (ATARAX) tablet 25 mg  25 mg Oral TID PRN White, Patrice L, NP       LORazepam (ATIVAN) tablet 2 mg  2 mg Oral TID PRN White, Patrice L, NP       Or   LORazepam (ATIVAN) injection 2 mg  2 mg Intramuscular TID PRN White, Patrice L, NP       magnesium hydroxide (MILK OF MAGNESIA) suspension 30 mL  30 mL Oral Daily PRN White, Patrice L, NP       traZODone (DESYREL) tablet 50 mg  50 mg Oral QHS PRN White, Patrice L, NP       PTA Medications: No medications prior to admission.    Patient Stressors: Financial difficulties   Marital or family conflict    Patient Strengths: General fund of knowledge  Supportive family/friends   Treatment Modalities: Medication Management, Group therapy, Case management,  1 to 1 session with clinician, Psychoeducation, Recreational therapy.   Physician Treatment Plan for Primary Diagnosis: Mood disorder (HCC) Long Term Goal(s):     Short Term Goals:    Medication Management:  Evaluate patient's response, side effects, and tolerance of medication regimen.  Therapeutic Interventions: 1 to 1 sessions, Unit Group sessions and Medication administration.  Evaluation of Outcomes: Not Progressing  Physician Treatment Plan for Secondary Diagnosis: Principal Problem:   Mood disorder (HCC)  Long Term Goal(s):     Short Term Goals:       Medication Management: Evaluate patient's response, side effects, and tolerance of medication regimen.  Therapeutic Interventions: 1 to 1 sessions, Unit Group sessions and Medication administration.  Evaluation of Outcomes: Not Progressing   RN Treatment Plan for Primary Diagnosis: Mood disorder (HCC) Long Term Goal(s): Knowledge of disease and therapeutic regimen to maintain health will improve  Short Term Goals: Ability to demonstrate self-control, Ability to participate in decision making will improve, Ability to verbalize feelings will improve, Ability to disclose and discuss suicidal ideas, Ability to identify and develop effective coping behaviors will improve, and Compliance with prescribed medications will improve  Medication Management: RN will administer medications as ordered by provider, will assess and evaluate patient's response and provide education to patient for prescribed medication. RN will report any adverse and/or side effects to prescribing provider.  Therapeutic Interventions: 1 on 1 counseling sessions, Psychoeducation, Medication administration, Evaluate responses to treatment, Monitor vital signs and  CBGs as ordered, Perform/monitor CIWA, COWS, AIMS and Fall Risk screenings as ordered, Perform wound care treatments as ordered.  Evaluation of Outcomes: Not Progressing   LCSW Treatment Plan for Primary Diagnosis: Mood disorder (HCC) Long Term Goal(s): Safe transition to appropriate next level of care at discharge, Engage patient in therapeutic group addressing interpersonal concerns.  Short Term Goals:  Engage patient in aftercare planning with referrals and resources, Increase social support, Increase emotional regulation, Facilitate acceptance of mental health diagnosis and concerns, Identify triggers associated with mental health/substance abuse issues, and Increase skills for wellness and recovery  Therapeutic Interventions: Assess for all discharge needs, 1 to 1 time with Social worker, Explore available resources and support systems, Assess for adequacy in community support network, Educate family and significant other(s) on suicide prevention, Complete Psychosocial Assessment, Interpersonal group therapy.  Evaluation of Outcomes: Not Progressing   Progress in Treatment: Attending groups: Yes. Participating in groups: Yes. Taking medication as prescribed: Yes. Toleration medication: Yes. Family/Significant other contact made: No, will contact:  Will contact whoever pt gives consent to upon assessment Patient understands diagnosis: Yes. Discussing patient identified problems/goals with staff: Yes. Medical problems stabilized or resolved: Yes. Denies suicidal/homicidal ideation: Yes. Issues/concerns per patient self-inventory: No.  New problem(s) identified: No, Describe:  none reported  New Short Term/Long Term Goal(s): medication stabilization, elimination of SI thoughts, development of comprehensive mental wellness plan.    Patient Goals:  "Leave as soon as possible and get a trauma informed therapist"  Discharge Plan or Barriers: Patient recently admitted. CSW will continue to follow and assess for appropriate referrals and possible discharge planning.    Reason for Continuation of Hospitalization: Anxiety Depression Medication stabilization Suicidal ideation  Estimated Length of Stay: 5-7 days  Last 3 [place] Suicide Severity Risk Score: Flowsheet Row [facility] (Current[facility]13/[facility]  C-SSRS RISK CATEGORY High Risk Moderate Risk       Last PHQ 2/9 Scores:    09/18/2018    8:56 AM 07/04/2018   10:42 AM 06/19/2018    1:07 PM  Depression screen PHQ 2/9  Decreased Interest 0 0 0  Down, Depressed, Hopeless 1 0 0  PHQ - 2 Score 1 0 0    Scribe for Treatment Team: Esmeralda Arthur 07/15/2023 12:41 PM

## 2023-07-15 NOTE — Plan of Care (Signed)
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
## 2023-07-15 NOTE — Group Note (Signed)
Date:  07/15/2023 Time:  10:35 AM  Group Topic/Focus:  Goals Group:   The focus of this group is to help patients establish daily goals to achieve during treatment and discuss how the patient can incorporate goal setting into their daily lives to aide in recovery.    Participation Level:  Active  Participation Quality:  Appropriate  Affect:  Appropriate  Cognitive:  Appropriate  Insight: Appropriate  Engagement in Group:  Engaged  Modes of Intervention:  Discussion  Additional Comments:  NA  [name] 07/15/2023, 10:35 AM

## 2023-07-15 NOTE — Progress Notes (Addendum)
Pt denied SI/HI/AVH this morning. Pt rated their depression a 5/10, anxiety a 5/10, and feelings of hopelessness a 5/10. Pt reports that they slept "poor" last night. Pt reports that their goal for today is " to stay calm, cooperative, meditate and shave face". Pt has been pleasant, calm, and cooperative throughout the shift. RN provided support and encouragement to patient. Pt given scheduled medications as prescribed. Q15 min checks verified for safety. Patient verbally contracts for safety. Patient compliant with medications and treatment plan. Patient is interacting well on the unit. Pt is safe on the unit.   07/15/23 0900  Psych Admission Type (Psych Patients Only)  Admission Status Involuntary  Psychosocial Assessment  Patient Complaints Anxiety;Depression  Eye Contact Fair  Facial Expression Anxious  Affect Anxious  Speech Logical/coherent  Interaction Assertive  Motor Activity Fidgety  Appearance/Hygiene Unremarkable  Behavior Characteristics Appropriate to situation;Cooperative  Mood Depressed;Anxious  Thought Process  Coherency WDL  Content WDL  Delusions None reported or observed  Perception WDL  Hallucination None reported or observed  Judgment Poor  Confusion None  Danger to Self  Current suicidal ideation? Denies  Description of Suicide Plan No plan  Self-Injurious Behavior No self-injurious ideation or behavior indicators observed or expressed   Agreement Not to Harm Self Yes  Description of Agreement Verbal  Danger to Others  Danger to Others None reported or observed

## 2023-07-15 NOTE — Group Note (Deleted)
Date:  07/15/2023 Time:  10:41 AM  Group Topic/Focus:  Goals Group:   The focus of this group is to help patients establish daily goals to achieve during treatment and discuss how the patient can incorporate goal setting into their daily lives to aide in recovery.     Participation Level:  {BHH PARTICIPATION ZOXWR:60454}  Participation Quality:  {BHH PARTICIPATION QUALITY:22265}  Affect:  {BHH AFFECT:22266}  Cognitive:  {BHH COGNITIVE:22267}  Insight: {BHH Insight2:20797}  Engagement in Group:  {BHH ENGAGEMENT IN UJWJX:91478}  Modes of Intervention:  {BHH MODES OF INTERVENTION:22269}  Additional Comments:  ***  [name] 07/15/2023, 10:41 AM

## 2023-07-15 NOTE — B[facility] Suicide Risk Assessment (Signed)
[facility] Admission Suicide Risk Assessment   Nursing information obtained from:  Patient Demographic factors:  Gay, lesbian, or bisexual orientation, Caucasian, Living alone Current Mental Status:  NA Loss Factors:  Loss of significant relationship Historical Factors:  Prior suicide attempts, Impulsivity Risk Reduction Factors:  Responsible for children under 40 years of age, Positive social support  Total Time spent with patient: 30 minutes Principal Problem: MDD (major depressive disorder), recurrent severe, without psychosis (HCC) Diagnosis:  Principal Problem:   MDD (major depressive disorder), recurrent severe, without psychosis (HCC) Active Problems:   GAD (generalized anxiety disorder)   PTSD (post-traumatic stress disorder)  Subjective Data: See H&P.  Patient having suicidal thoughts leading up to admission.  Denies SI on admission exam.  Patient tearful and emotionally labile on admission.  Apparently there is a very dramatic scene during the patient's being taken into custody for IVC.  Patient did realize he was a risk of harm to self and was asking friends to stay with him.  Will obtain more information from collateral.  Continued Clinical Symptoms:  Alcohol Use Disorder Identification Test Final Score (AUDIT): 1 The "Alcohol Use Disorders Identification Test", Guidelines for Use in Primary Care, Second Edition.  World Science writer [facility]). Score between 0-7:  no or low risk or alcohol related problems. Score between 8-15:  moderate risk of alcohol related problems. Score between 16-19:  high risk of alcohol related problems. Score 20 or above:  warrants further diagnostic evaluation for alcohol dependence and treatment.   CLINICAL FACTORS:   Severe Anxiety and/or Agitation Anorexia Nervosa Depression:   Anhedonia Impulsivity Insomnia Severe Alcohol/Substance Abuse/Dependencies More than one psychiatric diagnosis Unstable or Poor Therapeutic Relationship Previous  Psychiatric Diagnoses and Treatments    Psychiatric Specialty Exam:  Presentation  General Appearance:  Casual  Eye Contact: Good  Speech: Normal Rate  Speech Volume: Normal  Handedness: Right   Mood and Affect  Mood: Anxious; Depressed  Affect: Full Range; Tearful   Thought Process  Thought Processes: Linear  Descriptions of Associations:Intact  Orientation:Full (Time, Place and Person)  Thought Content:Logical  History of Schizophrenia/Schizoaffective disorder:No  Duration of Psychotic Symptoms:No data recorded Hallucinations:Hallucinations: None  Ideas of Reference:None  Suicidal Thoughts:Suicidal Thoughts: No  Homicidal Thoughts:Homicidal Thoughts: No   Sensorium  Memory: Immediate Fair; Recent Fair; Remote Fair  Judgment: Intact  Insight: Fair   Chartered certified accountant: Fair  Attention Span: Fair  Recall: [employer] of Knowledge: Fair  Language: Fair   Psychomotor Activity  Psychomotor Activity: Psychomotor Activity: Normal   Assets  Assets: Communication Skills; Intimacy; Leisure Time   Sleep  Sleep: Sleep: Fair    Physical Exam: Physical Exam See H&P   ROS See H&P   Blood pressure 116/86, pulse 66, temperature 97.8 F (36.6 C), temperature source Oral, resp. rate 16, height 5\' 9"  (1.753 m), weight 63.5 kg, SpO2 100%. Body mass index is 20.67 kg/m.   COGNITIVE FEATURES THAT CONTRIBUTE TO RISK:  None    SUICIDE RISK:   Moderate:  Frequent suicidal ideation with limited intensity, and duration, some specificity in terms of plans, no associated intent, good self-control, limited dysphoria/symptomatology, some risk factors present, and identifiable protective factors, including available and accessible social support.  PLAN OF CARE: See H&P   I certify that inpatient services furnished can reasonably be expected to improve the patient's condition.   Cristy Hilts, MD 07/15/2023,  3:41 PM

## 2023-07-15 NOTE — Progress Notes (Signed)
-  With pt permission, I called pt's friend [name] [phone]  Per Kathlene November - Pt has been spiraling. There seems to be a "ramping up, things could be ending soon." On day of the IVC, it was clear that he was not going to kill himself or get the police to kill him. Kathlene November states that on the day of IVC, the pt wrote a "last will" including his last wishes that Kathlene November and his wife would have custody of his child until his wife was psychiatrically evaluated/treated.  The night before the IVC, [name] had talked with a different friend, insinuating that if the pt were have to killed himself there would be grandiose outcomes "it would remind the world .Marland Kitchen.." Also stating isreal vs hamas comments.   We discussed the pt's diagnosis, treatment (Cymbalta), and discharge planning (expected not to dc less than 7 days). At the end of the call, Kathlene November had no further questions.

## 2023-07-15 NOTE — [facility] Group Notes (Signed)
[facility] Group Notes:  (Nursing/MHT/Case Management/Adjunct)  Date:  07/15/2023  Time:  10:01 PM  Type of Therapy:    Wrap Up Group    Participation Level:  Did Not Attend    Jackson [name] 07/15/2023, 10:01 PM

## 2023-07-15 NOTE — Plan of Care (Signed)
  Problem: Education: Goal: Emotional status will improve Outcome: Progressing Goal: Mental status will improve Outcome: Progressing   

## 2023-07-15 NOTE — Group Note (Signed)
Recreation Therapy Group Note   Group Topic:Problem Solving  Group Date: 07/15/2023 Start Time: 0930 End Time: 0950 Facilitators: Jennae Hakeem-McCall, LRT,CTRS Location: [address] Dayroom   Group Topic: Communication, Team Building, Problem Solving  Goal Area(s) Addresses:  Patient will effectively work with peer towards shared goal.  Patient will identify skills used to make activity successful.  Patient will identify how skills used during activity can be applied to reach post d/c goals.   Intervention: STEM Activity- Glass blower/designer  Group Description: Tallest Pharmacist, community. In teams of 5-6, patients were given 11 craft pipe cleaners. Using the materials provided, patients were instructed to compete again the opposing team(s) to build the tallest free-standing structure from floor level. The activity was timed; difficulty increased by Clinical research associate as Production designer, theatre/television/film continued.  Systematically resources were removed with additional directions for example, placing one arm behind their back, working in silence, and shape stipulations. LRT facilitated post-activity discussion reviewing team processes and necessary communication skills involved in completion. Patients were encouraged to reflect how the skills utilized, or not utilized, in this activity can be incorporated to positively impact support systems post discharge.  Education: Pharmacist, community, Scientist, physiological, Discharge Planning   Education Outcome: Acknowledges education/In group clarification offered/Needs additional education.    Affect/Mood: Appropriate   Participation Level: Engaged   Participation Quality: Independent   Behavior: Appropriate   Speech/Thought Process: Focused   Insight: Good   Judgement: Good   Modes of Intervention: STEM Activity   Patient Response to Interventions:  Engaged   Education Outcome:  In group clarification offered    Clinical Observations/Individualized Feedback: Pt  attended and participated in group session.    Plan: Continue to engage patient in RT group sessions 2-3x/week.   Jayra Choyce-McCall, LRT,CTRS 07/15/2023 12:03 PM

## 2023-07-16 MED ORDER — IBUPROFEN 400 MG PO TABS: 400.0000 mg | ORAL_TABLET | Freq: Four times a day (QID) | ORAL | Status: DC | PRN

## 2023-07-16 MED ORDER — ACETAMINOPHEN 325 MG PO TABS
650.0000 mg | ORAL_TABLET | Freq: Four times a day (QID) | ORAL | Status: DC | PRN
  Administered 2023-07-16 – 2023-07-20 (×7): 650 mg via ORAL
  Filled 2023-07-16 (×7): qty 2

## 2023-07-16 NOTE — Progress Notes (Signed)
[facility] MD Progress Note  07/16/2023 2:56 PM Jackson [name]  MRN:  [id number] Subjective:   Jackson [name] is a 42 yr old nonbinary person who presented on 10/2 to [facility] with SI with a plan to drive off the road, they were admitted to [facility] on 10/4.  PPHx is significant for MDD, GAD, and PTSD, and 3 Suicide Attempts (all in last year), and no history of Prior Psychiatric Hospitalizations.   Case was discussed in the multidisciplinary team. MAR was reviewed and patient was compliant with medications.  He received PRN Hydroxyzine last night and Tylenol this morning.   Psychiatric Team made the following recommendations yesterday: -Start Cymbalta 20 mg once daily    On interview today patient reports they slept fair last night.  They reports their appetite is doing fair.  They reports no SI, HI, or AVH.  They reports no Paranoia or Ideas of Reference.  They reports no issues with their medications.  They report that they have tolerated starting the Cymbalta yesterday and receiving the increased dose this morning.  They report that they still are not sure how to feel but still feel anxious and are trying to process everything.  They report that they have continued to have back pain but that the heating pad has been helpful.  They report that ibuprofen tends to work better for them and discussed that we could start this.  They report no other concerns at present.  Principal Problem: MDD (major depressive disorder), recurrent severe, without psychosis (HCC) Diagnosis: Principal Problem:   MDD (major depressive disorder), recurrent severe, without psychosis (HCC) Active Problems:   GAD (generalized anxiety disorder)   PTSD (post-traumatic stress disorder)  Total Time spent with patient:  I personally spent 35 minutes on the unit in direct patient care. The direct patient care time included face-to-face time with the patient, reviewing the patient's chart, communicating with other professionals,  and coordinating care. Greater than 50% of this time was spent in counseling or coordinating care with the patient regarding goals of hospitalization, psycho-education, and discharge planning needs.   Past Psychiatric History: MDD, GAD, and PTSD, and 3 Suicide Attempts (all in last year), and no history of Prior Psychiatric Hospitalizations.  Past Medical History:  Past Medical History:  Diagnosis Date   Abdominal cramping    Rectal bleeding    Toenail bruise    History reviewed. No pertinent surgical history. Family History:  Family History  Problem Relation Age of Onset   Heart attack Mother    Heart disease Mother    Hypothyroidism Mother    Heart disease Father    Family Psychiatric  History:  Father- Bipolar Disorder  No Known Suicides  Social History:  Social History   Substance and Sexual Activity  Alcohol Use Not Currently     Social History   Substance and Sexual Activity  Drug Use Yes   Types: Marijuana    Social History   Socioeconomic History   Marital status: Married    Spouse name: Not on file   Number of children: Not on file   Years of education: Not on file   Highest education level: Not on file  Occupational History   Not on file  Tobacco Use   Smoking status: Never   Smokeless tobacco: Never  Substance and Sexual Activity   Alcohol use: Not Currently   Drug use: Yes    Types: Marijuana   Sexual activity: Not on file  Other Topics Concern  Not on file  Social History Narrative   Not on file   Social Determinants of Health   Financial Resource Strain: Not on file  Food Insecurity: No Food Insecurity (07/14/2023)   Hunger Vital Sign    Worried About Running Out of Food in the Last Year: Never true    Ran Out of Food in the Last Year: Never true  Transportation Needs: No Transportation Needs (07/14/2023)   PRAPARE - Administrator, Civil Service (Medical): No    Lack of Transportation (Non-Medical): No  Physical Activity:  Not on file  Stress: Not on file  Social Connections: Not on file   Additional Social History:                         Sleep: Fair  Appetite:  Fair  Current Medications: Current Facility-Administered Medications  Medication Dose Route Frequency Provider Last Rate Last Admin   alum & mag hydroxide-simeth (MAALOX/MYLANTA) 200-200-20 MG/5ML suspension 30 mL  30 mL Oral Q4H PRN White, Patrice L, NP       diphenhydrAMINE (BENADRYL) capsule 50 mg  50 mg Oral TID PRN White, Patrice L, NP       Or   diphenhydrAMINE (BENADRYL) injection 50 mg  50 mg Intramuscular TID PRN White, Patrice L, NP       DULoxetine (CYMBALTA) DR capsule 30 mg  30 mg Oral Daily Massengill, Harrold Donath, MD   30 mg at 07/16/23 9604   haloperidol (HALDOL) tablet 5 mg  5 mg Oral TID PRN White, Patrice L, NP       Or   haloperidol lactate (HALDOL) injection 5 mg  5 mg Intramuscular TID PRN White, Patrice L, NP       hydrOXYzine (ATARAX) tablet 25 mg  25 mg Oral TID PRN White, Patrice L, NP   25 mg at 07/15/23 2128   ibuprofen (ADVIL) tablet 400 mg  400 mg Oral Q6H PRN Lauro Franklin, MD       LORazepam (ATIVAN) tablet 2 mg  2 mg Oral TID PRN White, Patrice L, NP       Or   LORazepam (ATIVAN) injection 2 mg  2 mg Intramuscular TID PRN White, Patrice L, NP       magnesium hydroxide (MILK OF MAGNESIA) suspension 30 mL  30 mL Oral Daily PRN White, Patrice L, NP       traZODone (DESYREL) tablet 50 mg  50 mg Oral QHS PRN White, Patrice L, NP        Lab Results: No results found for this or any previous visit (from the past 48 hour(s)).  Blood Alcohol level:  Lab Results  Component Value Date   ETH <10 07/13/2023    Metabolic Disorder Labs: Lab Results  Component Value Date   HGBA1C 4.8 06/19/2018   No results found for: "PROLACTIN" Lab Results  Component Value Date   CHOL 113 06/19/2018   TRIG 49 06/19/2018   HDL 42 06/19/2018   CHOLHDL 2.7 06/19/2018   LDLCALC 61 06/19/2018    Physical  Findings: AIMS:  , ,  ,  ,    CIWA:  CIWA-Ar Total: 0 COWS:     Musculoskeletal: Strength & Muscle Tone: within normal limits Gait & Station: normal Patient leans: N/A  Psychiatric Specialty Exam:  Presentation  General Appearance:  Appropriate for Environment; Casual  Eye Contact: Good  Speech: Clear and Coherent; Normal Rate  Speech Volume: Normal  Handedness: Right   Mood and Affect  Mood: Anxious; Dysphoric  Affect: Congruent   Thought Process  Thought Processes: Coherent; Linear  Descriptions of Associations:Intact  Orientation:Full (Time, Place and Person)  Thought Content:Logical; WDL  History of Schizophrenia/Schizoaffective disorder:No  Duration of Psychotic Symptoms:No data recorded Hallucinations:Hallucinations: None  Ideas of Reference:None  Suicidal Thoughts:Suicidal Thoughts: No  Homicidal Thoughts:Homicidal Thoughts: No   Sensorium  Memory: Immediate Fair; Recent Fair  Judgment: Intact  Insight: Fair   Chartered certified accountant: Fair  Attention Span: Fair  Recall: Fair  Fund of Knowledge: Fair  Language: Good   Psychomotor Activity  Psychomotor Activity: Psychomotor Activity: Normal   Assets  Assets: Communication Skills; Resilience   Sleep  Sleep: Sleep: Fair Number of Hours of Sleep: 7.5    Physical Exam: Physical Exam Vitals and nursing note reviewed.  Constitutional:      General: [name] is not in acute distress.    Appearance: Normal appearance. [name] is normal weight. [name] is not ill-appearing or toxic-appearing.  HENT:     Head: Normocephalic and atraumatic.  Pulmonary:     Effort: Pulmonary effort is normal.  Musculoskeletal:        General: Normal range of motion.  Neurological:     General: No focal deficit present.     Mental Status: [name] is alert.    Review of Systems  Respiratory:  Negative for cough and shortness of breath.   Cardiovascular:  Negative  for chest pain.  Gastrointestinal:  Negative for abdominal pain, constipation, diarrhea, nausea and vomiting.  Neurological:  Negative for dizziness, weakness and headaches.  Psychiatric/Behavioral:  Positive for depression. Negative for hallucinations and suicidal ideas. The patient is nervous/anxious.    Blood pressure 127/87, pulse 62, temperature (!) 97.4 F (36.3 C), resp. rate 16, height 5\' 9"  (1.753 m), weight 63.5 kg, SpO2 100%. Body mass index is 20.67 kg/m.   Treatment Plan Summary: Daily contact with patient to assess and evaluate symptoms and progress in treatment and Medication management  Isadore "[name]" Agne is a 42 yr old nonbinary person who presented on 10/2 to [facility] with SI with a plan to drive off the road, they were admitted to [facility] on 10/4.  PPHx is significant for MDD, GAD, and PTSD, and 3 Suicide Attempts (all in last year), and no history of Prior Psychiatric Hospitalizations.  [name] has tolerated starting Cymbalta and had the increased dose this without issue.  As they have responded better to ibuprofen in the past we will stop as needed Tylenol and start as needed ibuprofen.  We will not make any other changes to their medications at this time.  We will continue to monitor.   MDD, Recurrent, Severe, w/out Psychosis  GAD  PTSD: -Increase Cymbalta to 30 mg today for depression and anxiety -Continue Agitation Protocol: Haldol/Ativan/Benadryl   -Start PRN Ibuprofen -Stop PRN Tylenol -Continue PRN's: Maalox, Atarax, Milk of Magnesia, Trazodone   Lauro Franklin, MD 07/16/2023, 2:56 PM

## 2023-07-16 NOTE — Group Note (Signed)
Date:  07/16/2023 Time:  5:12 PM  Group Topic/Focus:  Wellness Toolbox:   The focus of this group is to discuss various aspects of wellness, balancing those aspects and exploring ways to increase the ability to experience wellness.  Patients will create a wellness toolbox for use upon discharge.    Participation Level:  Active  Participation Quality:  Appropriate  Affect:  Appropriate  Cognitive:  Appropriate  Insight: Appropriate  Engagement in Group:  Engaged  Modes of Intervention:  Exploration  Additional Comments:     Reymundo Poll 07/16/2023, 5:12 PM

## 2023-07-16 NOTE — [facility] Group Notes (Signed)
[facility] Group Notes:  (Nursing/MHT/Case Management/Adjunct)  Date:  07/16/2023  Time:  8:58 PM  Type of Therapy:   Wrap-up group  Participation Level:  Active  Participation Quality:  Appropriate  Affect:  Appropriate  Cognitive:  Appropriate  Insight:  Appropriate  Engagement in Group:  Engaged  Modes of Intervention:  Education  Summary of Progress/Problems: Pt goal  to stay calm. Pt reports meeting goal. Pt rated day 8/10.  Noah Delaine 07/16/2023, 8:58 PM

## 2023-07-16 NOTE — Group Note (Signed)
Date:  07/16/2023 Time:  10:15 AM  Group Topic/Focus:  Goals Group:   The focus of this group is to help patients establish daily goals to achieve during treatment and discuss how the patient can incorporate goal setting into their daily lives to aide in recovery.    Participation Level:  Active  Participation Quality:  Appropriate  Affect:  Appropriate  Cognitive:  Appropriate  Insight: Appropriate  Engagement in Group:  Engaged  Modes of Intervention:  Discussion  Additional Comments:     Reymundo Poll 07/16/2023, 10:15 AM

## 2023-07-16 NOTE — Plan of Care (Signed)

## 2023-07-16 NOTE — Progress Notes (Signed)
   07/16/23 2257  Psych Admission Type (Psych Patients Only)  Admission Status Involuntary  Psychosocial Assessment  Patient Complaints Anxiety  Eye Contact Fair  Facial Expression Animated  Affect Appropriate to circumstance  Speech Logical/coherent  Interaction Assertive  Motor Activity Fidgety  Appearance/Hygiene Unremarkable  Behavior Characteristics Appropriate to situation  Mood Anxious;Pleasant  Thought Process  Coherency WDL  Content WDL  Delusions None reported or observed  Perception WDL  Hallucination None reported or observed  Judgment Poor  Confusion None  Danger to Self  Current suicidal ideation? Denies  Self-Injurious Behavior No self-injurious ideation or behavior indicators observed or expressed   Agreement Not to Harm Self Yes  Description of Agreement verbal  Danger to Others  Danger to Others None reported or observed

## 2023-07-16 NOTE — Progress Notes (Signed)
   07/15/23 2049  Psych Admission Type (Psych Patients Only)  Admission Status Involuntary  Psychosocial Assessment  Patient Complaints Anxiety;Depression  Eye Contact Fair  Facial Expression Anxious  Affect Anxious  Speech Logical/coherent  Interaction Assertive  Motor Activity Fidgety  Appearance/Hygiene Unremarkable  Behavior Characteristics Appropriate to situation  Mood Depressed;Anxious  Thought Process  Coherency WDL  Content WDL  Delusions None reported or observed  Perception WDL  Hallucination None reported or observed  Judgment Poor  Confusion None  Danger to Self  Current suicidal ideation? Denies  Self-Injurious Behavior No self-injurious ideation or behavior indicators observed or expressed   Agreement Not to Harm Self Yes  Description of Agreement verbal  Danger to Others  Danger to Others None reported or observed

## 2023-07-16 NOTE — Progress Notes (Signed)
Patient ID: Jackson [name], adult   DOB: [birth year], 42 y.o.   MRN: [id number] Pt presents with anxious mood, affect congruent. [name] reports that they are feeling '' anxious and tired and my back is hurting '' the patient reports poor sleep last night despite documented sleep hours and states '' I was up since two am last night. I just had racing thoughts and couldn't sleep. My back hurts as well, I'm not sure if it's these beds or what. '' PRN tylenol given and patient with some improvement. Pt has been visible on the unit, attending programming. Above dicussed with treatment team including MD, NP and SW. Pt is safe on the unit. Pt completed self inventory and rates his depression and anxiety and hopelessness all at 3/10 on scale 10 is worst and 0 being none. Pt reports goal is to '' staying focused and calm. And to rest read and pray. '' Able to make needs known. Will con't to monitor.
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
## 2023-07-17 MED ORDER — DULOXETINE HCL 20 MG PO CPEP
40.0000 mg | ORAL_CAPSULE | Freq: Every day | ORAL | Status: DC
  Administered 2023-07-18 – 2023-07-20 (×3): 40 mg via ORAL
  Filled 2023-07-17 (×5): qty 2

## 2023-07-17 NOTE — Progress Notes (Signed)
   07/17/23 0530  15 Minute Checks  Location Bedroom  Visual Appearance Calm  Behavior Composed  Sleep (Behavioral Health Patients Only)  Calculate sleep? (Click Yes once per 24 hr at 0600 safety check) Yes  Documented sleep last 24 hours 7.25

## 2023-07-17 NOTE — Group Note (Signed)
Date:  07/17/2023 Time:  11:41 PM  Group Topic/Focus:  Wrap-Up Group:   The focus of this group is to help patients review their daily goal of treatment and discuss progress on daily workbooks.    Participation Level:  Active  Participation Quality:  Appropriate and Sharing  Affect:  Appropriate  Cognitive:  Appropriate  Insight: Appropriate  Engagement in Group:  Engaged  Modes of Intervention:  Activity and Socialization  Additional Comments:  The patient shared that he wouldn't originally rated his day a 10/10 but rated it a 9/10 due to "not being able to speak to son". However, the patient stated that he did not allow that to have much effect on his day and the interactions with others on the unit helped. The patient participated in the group activity at the end of group.   Jackson [name] 07/17/2023, 11:41 PM

## 2023-07-17 NOTE — Plan of Care (Signed)

## 2023-07-17 NOTE — Progress Notes (Signed)
   07/17/23 2144  Psych Admission Type (Psych Patients Only)  Admission Status Involuntary  Psychosocial Assessment  Patient Complaints None  Eye Contact Fair  Facial Expression Animated  Affect Appropriate to circumstance  Speech Logical/coherent  Interaction Assertive  Motor Activity Fidgety  Appearance/Hygiene Unremarkable  Behavior Characteristics Appropriate to situation  Mood Pleasant  Thought Process  Coherency WDL  Content WDL  Delusions None reported or observed  Perception WDL  Hallucination None reported or observed  Judgment Poor  Confusion None  Danger to Self  Current suicidal ideation? Denies  Self-Injurious Behavior No self-injurious ideation or behavior indicators observed or expressed   Agreement Not to Harm Self Yes  Description of Agreement verbal  Danger to Others  Danger to Others None reported or observed
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
## 2023-07-17 NOTE — Progress Notes (Addendum)
Patient ID: Jackson [name], adult   DOB: 25-Nov-1980, 42 y.o.   MRN: [id number] Patient presents with anxious mood, affect congruent. Jackson [name] has been visible on the unit, attending unit programming. Jackson [name] reports improved mood today, feeling '' better and with much better sleep'' the patient shared at length enjoying group activities and bonding with peers through shared experience. The patient denied any other acute concerns. Pt has been able to make their needs own, eating and drinking well . Pt completed self inventory and rates depression, hopelessness and anxiety all at 0/10 on scale 10 being worst, 0 being none. Pt states their goal is '' staying mindful and grateful and to work more on bible study, enjoy, nature, pray/meditate.'' Pt compliant with am medications. Will con't to monitor. Pt is safe.

## 2023-07-17 NOTE — Group Note (Signed)
Date:  07/17/2023 Time:  2:04 PM  Group Topic/Focus:  Goals Group:   The focus of this group is to help patients establish daily goals to achieve during treatment and discuss how the patient can incorporate goal setting into their daily lives to aide in recovery.    Participation Level:  Active  Participation Quality:  Appropriate  Affect:  Appropriate  Cognitive:  Appropriate  Insight: Good  Engagement in Group:  Engaged  Modes of Intervention:  Discussion  Additional Comments:    [name] 07/17/2023, 2:04 PM

## 2023-07-17 NOTE — [facility] Group Notes (Addendum)
[facility] Group Notes:  (Nursing/MHT/Case Management/Adjunct)  Date:  07/17/2023  Time:  2:50 PM  Type of Therapy:  Psychoeducational Skills  Participation Level:  Active  Participation Quality:  Appropriate  Affect:  Appropriate  Cognitive:  Alert and Appropriate  Insight:  Appropriate  Engagement in Group:  Engaged  Modes of Intervention:  Discussion, Education, and Exploration  Summary of Progress/Problems: A mental health wellness podcast was play ''Berniece Pap On Purpose'' with themes of recognizing impulsive behaviors and how to implement the 90 second rule. Patients were then educated on positive reframing and how to recognize negative thinking patterns. Pt attended and was appropriate.   Malva Limes 07/17/2023, 2:50 PM

## 2023-07-17 NOTE — [facility] Group Notes (Signed)
Type of Therapy and Topic:  Group Therapy: Gratitude  Participation Level:  Active   Description of Group:   In this group, patients shared and discussed the importance of acknowledging the elements in their lives for which they are grateful and how this can positively impact their mood.  The group discussed how bringing the positive elements of their lives to the forefront of their minds can help with recovery from any illness, physical or mental.  An exercise was done as a group in which a list was made of gratitude items in order to encourage participants to consider other potential positives in their lives.  Therapeutic Goals: Patients will identify one or more item for which they are grateful in each of 6 categories:  people, experiences, things, places, skills, and other. Patients will discuss how it is possible to seek out gratitude in even bad situations. Patients will explore other possible items of gratitude that they could remember.   Summary of Patient Progress:  The patient shared that "they"  are grateful for Jackson [name] (partner).  Patient's reaction to the group was engaging and was very mindful in their thought.  Therapeutic Modalities:   Solution-Focused Therapy Activity

## 2023-07-17 NOTE — Plan of Care (Signed)
  Problem: Education: Goal: Verbalization of understanding the information provided will improve Outcome: Progressing   Problem: Activity: Goal: Sleeping patterns will improve Outcome: Progressing   

## 2023-07-18 NOTE — Group Note (Signed)
Recreation Therapy Group Note   Group Topic:Stress Management  Group Date: 07/18/2023 Start Time: 0930 End Time: 0950 Facilitators: Kairon Shock-McCall, LRT,CTRS Location: 300 Hall Dayroom   Group Topic: Stress Management   Goal Area(s) Addresses:  Patient will actively participate in stress management techniques presented during session.  Patient will successfully identify benefit of practicing stress management post d/c.   Intervention: Relaxation exercise with ambient sound and script   Group Description: Guided Imagery. LRT provided education, instruction, and demonstration on practice of visualization via guided imagery. Patient was asked to participate in the technique introduced during session. LRT debriefed including topics of mindfulness, stress management and specific scenarios each patient could use these techniques. Patients were given suggestions of ways to access scripts post d/c and encouraged to explore Youtube and other apps available on smartphones, tablets, and computers.  Education:  Stress Management, Discharge Planning.   Education Outcome: Acknowledges education   Affect/Mood: N/A   Participation Level: Engaged   Participation Quality: Independent   Behavior: Appropriate   Speech/Thought Process: Focused   Insight: Good   Judgement: Good   Modes of Intervention: Script   Patient Response to Interventions:  Engaged   Education Outcome:  In group clarification offered    Clinical Observations/Individualized Feedback: Pt attended group during the 400 hall session. Pt was engaged and focused. Pt identified being in a little hobbit cabin in a rabbit hole with their dog from childhood.      Plan: Continue to engage patient in RT group sessions 2-3x/week.   Jackson [name], LRT,CTRS 07/18/2023 12:54 PM

## 2023-07-18 NOTE — Progress Notes (Signed)
   07/18/23 0830  Psych Admission Type (Psych Patients Only)  Admission Status Involuntary  Psychosocial Assessment  Patient Complaints Anxiety  Eye Contact Fair  Facial Expression Animated  Affect Appropriate to circumstance  Speech Logical/coherent  Interaction Assertive  Motor Activity Fidgety  Appearance/Hygiene Unremarkable  Behavior Characteristics Appropriate to situation  Mood Anxious;Pleasant  Thought Process  Coherency WDL  Content WDL  Delusions None reported or observed  Perception WDL  Hallucination None reported or observed  Judgment Poor  Confusion None  Danger to Self  Current suicidal ideation? Denies  Self-Injurious Behavior No self-injurious ideation or behavior indicators observed or expressed   Agreement Not to Harm Self Yes  Description of Agreement Verbal  Danger to Others  Danger to Others None reported or observed

## 2023-07-18 NOTE — Group Note (Signed)
Date:  07/18/2023 Time:  10:20 AM  Group Topic/Focus:  Goals Group:   The focus of this group is to help patients establish daily goals to achieve during treatment and discuss how the patient can incorporate goal setting into their daily lives to aide in recovery.    Participation Level:  Active  Participation Quality:  Appropriate  Affect:  Appropriate  Cognitive:  Appropriate  Insight: Appropriate  Engagement in Group:  Engaged  Modes of Intervention:  Discussion  Additional Comments:     Reymundo Poll 07/18/2023, 10:20 AM

## 2023-07-18 NOTE — [facility] Group Notes (Signed)
Pt did attend AA group  

## 2023-07-18 NOTE — Progress Notes (Signed)
CSW met with pt and discussed obtaining consent to speak with his spouse and or his mother to address safety concerns. Pt provided contact information for his friend Jackson [name]) and remains guarded concerning providing consent for his estranged spouse or his mother. Pt asserts that he does not believe that either have his best interest at heart. Pt does not provide consent for his estranged spouse or his mother.

## 2023-07-18 NOTE — Progress Notes (Signed)
   07/18/23 0708  15 Minute Checks  Location Cafeteria  Visual Appearance Calm  Behavior Composed  Sleep (Behavioral Health Patients Only)  Calculate sleep? (Click Yes once per 24 hr at 0600 safety check) Yes  Documented sleep last 24 hours 6.25

## 2023-07-18 NOTE — [facility] Suicide Risk Assessment (Signed)
[facility] INPATIENT:  Family/Significant Other Suicide Prevention Education  Suicide Prevention Education:  Education Completed; Jackson [name], friend has been identified by the patient as the friend with whom the patient will be residing, and identified as the person(Jackson) who will aid the patient in the event of a mental health crisis (suicidal ideations/suicide attempt).  With written consent from the patient, the family member/significant other has been provided the following suicide prevention education, prior to the and/or following the discharge of the patient.  The suicide prevention education provided includes the following: Suicide risk factors Suicide prevention and interventions National Suicide Hotline telephone number [facility] assessment telephone number [facility] Emergency Assistance 911 [facility] and/or Residential Mobile Crisis Unit telephone number  Request made of Jackson [name] (Friend) Remove weapons (e.g., guns, rifles, knives), all items previously/currently identified as safety concern.   Remove drugs/medications (over-the-counter, prescriptions, illicit drugs), all items previously/currently identified as a safety concern.  Jackson [name] verbalizes understanding of the suicide prevention education information provided.  The family member/significant other agrees to remove the items of safety concern listed above.  Jackson [name] Jackson [name] 07/18/2023, 3:18 PM

## 2023-07-18 NOTE — Progress Notes (Signed)
[facility] MD Progress Note  07/18/2023 2:07 PM Jackson [name]  MRN:  [id number] Subjective:  "I'm doing pretty good"  Jackson [name] was seen today in the room on rounds. The patient feels that depression is improved. She is disappointed that discharge didn't happen yesterday but is coping with it. The groups have been good. No hallucinations, thoughts of harm to self or others. Discussed family dynamics some. Appetite not that good. Up early today with back pain.   Principal Problem: MDD (major depressive disorder), recurrent severe, without psychosis (HCC) Diagnosis: Principal Problem:   MDD (major depressive disorder), recurrent severe, without psychosis (HCC) Active Problems:   GAD (generalized anxiety disorder)   PTSD (post-traumatic stress disorder)  Total Time spent with patient: 20 minutes  Past Psychiatric History: Patient reports that he was diagnosed with PTSD a few years ago at [facility]. Denies any history of psychiatric medication treatment or current psychiatric medication treatment. Reports he was in therapy in the past, but has not had a therapist for about 1 year. Denies any previous psychiatric hospitalizations. Patient reports one history of suicide attempt via natural gas less than 1 year ago.   Past Medical History:  Past Medical History:  Diagnosis Date   Abdominal cramping    Rectal bleeding    Toenail bruise    History reviewed. No pertinent surgical history. Family History:  Family History  Problem Relation Age of Onset   Heart attack Mother    Heart disease Mother    Hypothyroidism Mother    Heart disease Father    Family Psychiatric  History: Patient reports that father was diagnosed with bipolar disorder. Denies any known history of suicide attempts in the family.  Social History:  Social History   Substance and Sexual Activity  Alcohol Use Not Currently     Social History   Substance and Sexual Activity  Drug Use Yes   Types: Marijuana    Social History    Socioeconomic History   Marital status: Married    Spouse name: Not on file   Number of children: Not on file   Years of education: Not on file   Highest education level: Not on file  Occupational History   Not on file  Tobacco Use   Smoking status: Never   Smokeless tobacco: Never  Substance and Sexual Activity   Alcohol use: Not Currently   Drug use: Yes    Types: Marijuana   Sexual activity: Not on file  Other Topics Concern   Not on file  Social History Narrative   Not on file   Social Determinants of Health   Financial Resource Strain: Not on file  Food Insecurity: No Food Insecurity (07/14/2023)   Hunger Vital Sign    Worried About Running Out of Food in the Last Year: Never true    Ran Out of Food in the Last Year: Never true  Transportation Needs: No Transportation Needs (07/14/2023)   PRAPARE - Administrator, Civil Service (Medical): No    Lack of Transportation (Non-Medical): No  Physical Activity: Not on file  Stress: Not on file  Social Connections: Not on file   Additional Social History: Patient reports that he has a part-time job, is separated from his wife, and has 1 child that is 24 years old.        Sleep: Fair  Appetite:  Poor  Current Medications: Current Facility-Administered Medications  Medication Dose Route Frequency Provider Last Rate Last Admin   acetaminophen (TYLENOL) tablet  650 mg  650 mg Oral Q6H PRN Lauro [place], MD   650 mg at 07/18/23 0807   alum & mag hydroxide-simeth (MAALOX/MYLANTA) 200-200-20 MG/5ML suspension 30 mL  30 mL Oral Q4H PRN White, Patrice L, NP       diphenhydrAMINE (BENADRYL) capsule 50 mg  50 mg Oral TID PRN White, Patrice L, NP       Or   diphenhydrAMINE (BENADRYL) injection 50 mg  50 mg Intramuscular TID PRN White, Patrice L, NP       DULoxetine (CYMBALTA) DR capsule 40 mg  40 mg Oral Daily Lauro [place], MD   40 mg at 07/18/23 6578   haloperidol (HALDOL) tablet 5 mg  5 mg Oral TID  PRN Liborio Nixon L, NP       Or   haloperidol lactate (HALDOL) injection 5 mg  5 mg Intramuscular TID PRN White, Patrice L, NP       hydrOXYzine (ATARAX) tablet 25 mg  25 mg Oral TID PRN White, Patrice L, NP   25 mg at 07/15/23 2128   LORazepam (ATIVAN) tablet 2 mg  2 mg Oral TID PRN White, Patrice L, NP       Or   LORazepam (ATIVAN) injection 2 mg  2 mg Intramuscular TID PRN White, Patrice L, NP       magnesium hydroxide (MILK OF MAGNESIA) suspension 30 mL  30 mL Oral Daily PRN White, Patrice L, NP       traZODone (DESYREL) tablet 50 mg  50 mg Oral QHS PRN White, Patrice L, NP   50 mg at 07/17/23 2105    Lab Results: No results found for this or any previous visit (from the past 48 hour(s)).  Blood Alcohol level:  Lab Results  Component Value Date   ETH <10 07/13/2023    Metabolic Disorder Labs: Lab Results  Component Value Date   HGBA1C 4.8 06/19/2018   No results found for: "PROLACTIN" Lab Results  Component Value Date   CHOL 113 06/19/2018   TRIG 49 06/19/2018   HDL 42 06/19/2018   CHOLHDL 2.7 06/19/2018   LDLCALC 61 06/19/2018    Physical Findings: AIMS:  , ,  ,  ,    CIWA:  CIWA-Ar Total: 0 COWS:     Musculoskeletal: Strength & Muscle Tone: within normal limits Gait & Station: normal Patient leans: N/A  Psychiatric Specialty Exam:  Presentation  General Appearance:  Appropriate for Environment  Eye Contact: Good  Speech: Normal Rate  Speech Volume: Normal  Handedness: Right   Mood and Affect  Mood: Euthymic  Affect: Appropriate   Thought Process  Thought Processes: Linear  Descriptions of Associations:Intact  Orientation:Full (Time, Place and Person)  Thought Content:Abstract Reasoning  History of Schizophrenia/Schizoaffective disorder:No  Duration of Psychotic Symptoms:No data recorded Hallucinations:Hallucinations: None  Ideas of Reference:None  Suicidal Thoughts:Suicidal Thoughts: No  Homicidal Thoughts:Homicidal  Thoughts: No   Sensorium  Memory: Immediate Good; Remote Fair  Judgment: Fair  Insight: Fair   Art therapist  Concentration: Fair  Attention Span: Good  Recall: Good  Fund of Knowledge: Good  Language: Good   Psychomotor Activity  Psychomotor Activity: Psychomotor Activity: Normal   Assets  Assets: Communication Skills; Physical Health; Desire for Improvement; Social Support   Sleep  Sleep: Sleep: Fair Number of Hours of Sleep: 7.25    Physical Exam: Physical Exam Constitutional:      Appearance: Jackson [name] is normal weight.  HENT:     Head: Normocephalic and  atraumatic.  Eyes:     Extraocular Movements: Extraocular movements intact.  Pulmonary:     Effort: Pulmonary effort is normal.  Musculoskeletal:        General: Normal range of motion.     Cervical back: Normal range of motion.  Neurological:     General: No focal deficit present.     Mental Status: Jackson [name] is alert and oriented to person, place, and time.  Psychiatric:        Behavior: Behavior normal.    Review of Systems  Constitutional:  Negative for fever and malaise/fatigue.  Gastrointestinal:  Negative for constipation and diarrhea.  Musculoskeletal:  Positive for back pain. Negative for myalgias.  Neurological:  Negative for headaches.  Psychiatric/Behavioral:  Negative for hallucinations and suicidal ideas.    Blood pressure 115/85, pulse 76, temperature 97.6 F (36.4 C), temperature source Oral, resp. rate 16, height 5\' 9"  (1.753 m), weight 63.5 kg, SpO2 100%. Body mass index is 20.67 kg/m.   Treatment Plan Summary: Daily contact with patient to assess and evaluate symptoms and progress in treatment and Medication management   Jackson [name] is a 42 yr old nonbinary person who presented on 10/2 to [facility] with SI with a plan to drive off the road, they were admitted to [facility] on 10/4.  PPHx is significant for MDD, GAD, and PTSD, and 3 Suicide Attempts (all in last  year), and no history of Prior Psychiatric Hospitalizations.   Jackson [name] is reporting some improvement but does continue to have depression and chronic pain.  To address this we will further increase their Cymbalta.  They did report side effects to ibuprofen so Tylenol was restarted yesterday.  We will not make any other changes to their medication at this time.  We will continue to monitor.     MDD, Recurrent, Severe, w/out Psychosis  GAD  PTSD: -continue Cymbalta to 40 mg today for depression and anxiety -Continue Agitation Protocol: Haldol/Ativan/Benadryl     -continue PRN Ibuprofen -Stopped PRN Tylenol -Continue PRN's: Maalox, Atarax, Milk of Magnesia, Trazodone   I certify that inpatient services furnished can reasonably be expected to improve the patient's condition.    Roselle Locus, MD 07/18/2023, 2:07 PM

## 2023-07-18 NOTE — Plan of Care (Signed)
  Problem: Education: Goal: Mental status will improve Outcome: Progressing   Problem: Education: Goal: Emotional status will improve Outcome: Progressing   Problem: Activity: Goal: Interest or engagement in activities will improve Outcome: Progressing Goal: Sleeping patterns will improve Outcome: Progressing   Problem: Physical Regulation: Goal: Ability to maintain clinical measurements within normal limits will improve Outcome: Progressing   Problem: Safety: Goal: Periods of time without injury will increase Outcome: Progressing

## 2023-07-18 NOTE — Plan of Care (Signed)
  Problem: Education: Goal: Mental status will improve Outcome: Progressing   Problem: Activity: Goal: Sleeping patterns will improve Outcome: Progressing   

## 2023-07-19 MED ORDER — LIDOCAINE 5 % EX PTCH
1.0000 | MEDICATED_PATCH | CUTANEOUS | Status: DC
  Filled 2023-07-19: qty 1

## 2023-07-19 MED ORDER — LIDOCAINE 5 % EX PTCH
1.0000 | MEDICATED_PATCH | Freq: Every day | CUTANEOUS | Status: DC
  Filled 2023-07-19 (×4): qty 1

## 2023-07-19 NOTE — Progress Notes (Signed)
[facility] MD Progress Note  07/19/2023 1:55 PM Jackson [name]  MRN:  [id number] Subjective:   Jackson [name] was seen today in their room. They feel that they are doing "pretty good" overall. Sleep wasn't as good due to back pain. Appetite is regular and improved from yesterday. No current hallucinations, thoughts of harm to self or others. They plan to talk to their grandmother today about staying there for a bit. There is a lot of mixed emotions about the town that he grew up in.   Principal Problem: MDD (major depressive disorder), recurrent severe, without psychosis (HCC) Diagnosis: Principal Problem:   MDD (major depressive disorder), recurrent severe, without psychosis (HCC) Active Problems:   GAD (generalized anxiety disorder)   PTSD (post-traumatic stress disorder)  Total Time spent with patient: 20 minutes  Past Psychiatric History: Patient reports that he was diagnosed with PTSD a few years ago at [facility]. Denies any history of psychiatric medication treatment or current psychiatric medication treatment. Reports he was in therapy in the past, but has not had a therapist for about 1 year. Denies any previous psychiatric hospitalizations. Patient reports one history of suicide attempt via natural gas less than 1 year ago.   Past Medical History:  Past Medical History:  Diagnosis Date   Abdominal cramping    Rectal bleeding    Toenail bruise    History reviewed. No pertinent surgical history. Family History:  Family History  Problem Relation Age of Onset   Heart attack Mother    Heart disease Mother    Hypothyroidism Mother    Heart disease Father    Family Psychiatric  History:  Patient reports that father was diagnosed with bipolar disorder. Denies any known history of suicide attempts in the family.   Social History:  Social History   Substance and Sexual Activity  Alcohol Use Not Currently     Social History   Substance and Sexual Activity  Drug Use Yes   Types: Marijuana     Social History   Socioeconomic History   Marital status: Married    Spouse name: Not on file   Number of children: Not on file   Years of education: Not on file   Highest education level: Not on file  Occupational History   Not on file  Tobacco Use   Smoking status: Never   Smokeless tobacco: Never  Substance and Sexual Activity   Alcohol use: Not Currently   Drug use: Yes    Types: Marijuana   Sexual activity: Not on file  Other Topics Concern   Not on file  Social History Narrative   Not on file   Social Determinants of Health   Financial Resource Strain: Not on file  Food Insecurity: No Food Insecurity (07/14/2023)   Hunger Vital Sign    Worried About Running Out of Food in the Last Year: Never true    Ran Out of Food in the Last Year: Never true  Transportation Needs: No Transportation Needs (07/14/2023)   PRAPARE - Administrator, Civil Service (Medical): No    Lack of Transportation (Non-Medical): No  Physical Activity: Not on file  Stress: Not on file  Social Connections: Not on file   Additional Social History:                         Sleep: Poor  Appetite:  Good  Current Medications: Current Facility-Administered Medications  Medication Dose Route Frequency Provider Last Rate Last  Admin   acetaminophen (TYLENOL) tablet 650 mg  650 mg Oral Q6H PRN Lauro [place], MD   650 mg at 07/19/23 0732   alum & mag hydroxide-simeth (MAALOX/MYLANTA) 200-200-20 MG/5ML suspension 30 mL  30 mL Oral Q4H PRN White, Patrice L, NP       diphenhydrAMINE (BENADRYL) capsule 50 mg  50 mg Oral TID PRN White, Patrice L, NP       Or   diphenhydrAMINE (BENADRYL) injection 50 mg  50 mg Intramuscular TID PRN White, Patrice L, NP       DULoxetine (CYMBALTA) DR capsule 40 mg  40 mg Oral Daily Lauro [place], MD   40 mg at 07/19/23 0731   haloperidol (HALDOL) tablet 5 mg  5 mg Oral TID PRN Liborio Nixon L, NP       Or   haloperidol lactate (HALDOL)  injection 5 mg  5 mg Intramuscular TID PRN White, Patrice L, NP       hydrOXYzine (ATARAX) tablet 25 mg  25 mg Oral TID PRN White, Patrice L, NP   25 mg at 07/15/23 2128   lidocaine (LIDODERM) 5 % 1 patch  1 patch Transdermal Q24H Haidy Kackley, Shelbie Hutching, MD       LORazepam (ATIVAN) tablet 2 mg  2 mg Oral TID PRN White, Patrice L, NP       Or   LORazepam (ATIVAN) injection 2 mg  2 mg Intramuscular TID PRN White, Patrice L, NP       magnesium hydroxide (MILK OF MAGNESIA) suspension 30 mL  30 mL Oral Daily PRN White, Patrice L, NP       traZODone (DESYREL) tablet 50 mg  50 mg Oral QHS PRN White, Patrice L, NP   50 mg at 07/18/23 2124    Lab Results: No results found for this or any previous visit (from the past 48 hour(s)).  Blood Alcohol level:  Lab Results  Component Value Date   ETH <10 07/13/2023    Metabolic Disorder Labs: Lab Results  Component Value Date   HGBA1C 4.8 06/19/2018   No results found for: "PROLACTIN" Lab Results  Component Value Date   CHOL 113 06/19/2018   TRIG 49 06/19/2018   HDL 42 06/19/2018   CHOLHDL 2.7 06/19/2018   LDLCALC 61 06/19/2018    Physical Findings: AIMS:  , ,  ,  ,    CIWA:  CIWA-Ar Total: 0 COWS:     Musculoskeletal: Strength & Muscle Tone: within normal limits Gait & Station: normal Patient leans: N/A  Psychiatric Specialty Exam:  Presentation  General Appearance:  Appropriate for Environment  Eye Contact: Good  Speech: Normal Rate  Speech Volume: Normal  Handedness: Right   Mood and Affect  Mood: Euthymic  Affect: Full Range   Thought Process  Thought Processes: Linear  Descriptions of Associations:Intact  Orientation:Full (Time, Place and Person)  Thought Content:Logical  History of Schizophrenia/Schizoaffective disorder:No  Duration of Psychotic Symptoms:No data recorded Hallucinations:Hallucinations: None  Ideas of Reference:None  Suicidal Thoughts:Suicidal Thoughts: No  Homicidal  Thoughts:Homicidal Thoughts: No   Sensorium  Memory: Immediate Good; Remote Good  Judgment: Fair  Insight: Fair   Art therapist  Concentration: Fair  Attention Span: Fair  Recall: Good  Fund of Knowledge: Good  Language: Good   Psychomotor Activity  Psychomotor Activity: Psychomotor Activity: Normal   Assets  Assets: Communication Skills; Physical Health; Desire for Improvement; Social Support   Sleep  Sleep: Sleep: Fair    Physical Exam: Physical  Exam Vitals and nursing note reviewed.  HENT:     Head: Normocephalic and atraumatic.  Eyes:     Extraocular Movements: Extraocular movements intact.  Pulmonary:     Effort: Pulmonary effort is normal.  Musculoskeletal:        General: Normal range of motion.     Cervical back: Normal range of motion.  Neurological:     General: No focal deficit present.     Mental Status: Jackson [name] is alert and oriented to person, place, and time.  Psychiatric:        Behavior: Behavior normal.    Review of Systems  Constitutional:  Negative for fever.  HENT:  Negative for hearing loss.   Gastrointestinal:  Negative for constipation and diarrhea.  Musculoskeletal:  Positive for back pain. Negative for myalgias.  Psychiatric/Behavioral:  Negative for hallucinations and suicidal ideas.    Blood pressure 132/85, pulse 63, temperature 97.9 F (36.6 C), temperature source Oral, resp. rate 14, height 5\' 9"  (1.753 m), weight 63.5 kg, SpO2 100%. Body mass index is 20.67 kg/m.   Treatment Plan Summary: Daily contact with patient to assess and evaluate symptoms and progress in treatment and Medication management   Froylan "[name]" Shetley is a 42 yr old nonbinary person who presented on 10/2 to [facility] with SI with a plan to drive off the road, they were admitted to [facility] on 10/4.  PPHx is significant for MDD, GAD, and PTSD, and 3 Suicide Attempts (all in last year), and no history of Prior Psychiatric Hospitalizations.    [name] is reporting some improvement but does continue to have depression and chronic pain.  He is tolerating Cymbalta.  They did report side effects to ibuprofen so Tylenol was restarted yesterday.  Added lidocaine patch today. We will not make any other changes to their medication at this time.  We will continue to monitor.     MDD, Recurrent, Severe, w/out Psychosis  GAD  PTSD: -continue Cymbalta to 40 mg today for depression and anxiety -Continue Agitation Protocol: Haldol/Ativan/Benadryl     -continue PRN Ibuprofen -added lidocaine patch -Continue PRN's: Maalox, Atarax, Milk of Magnesia, Trazodone    I certify that inpatient services furnished can reasonably be expected to improve the patient's condition.    Roselle Locus, MD 07/19/2023, 1:55 PM

## 2023-07-19 NOTE — Plan of Care (Signed)

## 2023-07-19 NOTE — Plan of Care (Signed)

## 2023-07-19 NOTE — [facility] Group Notes (Signed)
Adult Psychoeducational Group Note  Date:  07/19/2023 Time:  9:00 PM  Group Topic/Focus:  Wrap-Up Group:   The focus of this group is to help patients review their daily goal of treatment and discuss progress on daily workbooks.  Participation Level:  Active  Participation Quality:  Appropriate  Affect:  Appropriate  Cognitive:  Appropriate  Insight: Appropriate  Engagement in Group:  Engaged  Modes of Intervention:  Discussion and Support  Additional Comments:  Pt told that today was a good day on the unit, the highlight of which was talking to his nine-year-old son on the phone. On the subject of staying well upon discharge, Pt mentioned cutting toxic people out of his life, which he planned to do by moving in with his grandmother, which would also help with his sobriety. Pt rated his day an 8 out of 10.  Christ Kick 07/19/2023, 9:00 PM

## 2023-07-19 NOTE — Group Note (Signed)
LCSW Group Therapy Note   Group Date: 07/19/2023 Start Time: 1100 End Time: 1200  Type of Therapy and Topic:  Group Therapy: Are You Under Stress?!?!  Participation Level:  Active  Description of Group: This process group involved patients examining stress and how it impacts their lives. Distress and eustress definitions were explained and patients identified both good and bad stressors in their lives. Accompanying worksheet was used to help patients identify the physical and emotional symptoms of stress and techniques/copings skills they utilize to help reduce these symptoms.    Therapeutic Goals:  1.  Patients will talk about their experiences with distress and eustress. 2. Patients will identify stressors in their lives and the physical and emotional  symptoms they experience while under stress. 3. Patients to identify actions/coping skills that they utilize to help ameliorate  symptoms of stress.  Summary of Patient Progress:   Pt attended group   Therapeutic Modalities:  Cognitive Behavioral Therapy Solution-Focused Therapy  Izell Teller LCSW 07/19/2023 2:38 PM

## 2023-07-19 NOTE — Progress Notes (Addendum)
Patient presents with anxious mood, affect congruent. [name] has been visible on the unit, attending unit programming and socializing with peers. [name] reports improved mood, feeling '' better and I really think coming to the hospital has helped, I'm more focused now on when I can go home'' the patient shared at length enjoying group activities but does report poor sleep due to '' not having ear plugs and just the opening of the door'' . The patient denied any other acute concerns, does con't to have some chronic pain for which prn tylenol and heat packs has been useful. Pt has been able to make their needs own, eating and drinking well . Pt completed self inventory and rates hopelessness and anxiety all at 0/10 on scale 10 being worst, 0 being none.pt rates depression at 1/10 on same scale. Pt states their goal is '' hopefully finalizing a discharge plan''. Pt shared with Clinical research associate they are hopeful to go to [place] at discharge to live with grandmother away from '' toxic relationships.'' Pt compliant with am medications. Will con't to monitor. Pt is safe.

## 2023-07-19 NOTE — [facility] Group Notes (Signed)
[facility] Group Notes:  (Nursing/MHT/Case Management/Adjunct)  Date:  07/19/2023  Time:  2:05 PM  Type of Therapy:  Psychoeducational Skills  Participation Level:  Active  Participation Quality:  Appropriate  Affect:  Appropriate  Cognitive:  Alert and Appropriate  Insight:  Appropriate  Engagement in Group:  Engaged  Modes of Intervention:  Discussion, Education, and Exploration  Summary of Progress/Problems:  Psychoeducational group discussing recognizing mental decompensation warning signs and how to implement healthy coping skills to promote mental wellbeing. Pt attended and was appropriate.   Malva Limes 07/19/2023, 2:05 PM

## 2023-07-19 NOTE — Progress Notes (Signed)
   07/18/23 2357  Psych Admission Type (Psych Patients Only)  Admission Status Involuntary  Psychosocial Assessment  Patient Complaints Anxiety  Eye Contact Fair  Facial Expression Animated  Affect Appropriate to circumstance  Speech Logical/coherent  Interaction Assertive  Motor Activity Fidgety  Appearance/Hygiene Unremarkable  Behavior Characteristics Appropriate to situation  Mood Anxious  Thought Process  Coherency WDL  Content WDL  Delusions None reported or observed  Perception WDL  Hallucination None reported or observed  Judgment Poor  Confusion None  Danger to Self  Current suicidal ideation? Denies  Self-Injurious Behavior No self-injurious ideation or behavior indicators observed or expressed   Agreement Not to Harm Self Yes  Description of Agreement verbal  Danger to Others  Danger to Others None reported or observed

## 2023-07-19 NOTE — Group Note (Signed)
Recreation Therapy Group Note   Group Topic:Animal Assisted Therapy   Group Date: 07/19/2023 Start Time: 8295 End Time: 1035 Facilitators: Oaklyn Mans-McCall, LRT,CTRS Location: [address] Dayroom   Animal-Assisted Activity (AAA) Program Checklist/Progress Notes Patient Eligibility Criteria Checklist & Daily Group note for Rec Tx Intervention  AAA/T Program Assumption of Risk Form signed by Patient/ or Parent Legal Guardian Yes  Patient is free of allergies or severe asthma Yes  Patient reports no fear of animals Yes  Patient reports no history of cruelty to animals Yes  Patient understands his/her participation is voluntary Yes  Patient washes hands before animal contact Yes  Patient washes hands after animal contact Yes  Education: Hand Washing, Appropriate Animal Interaction   Education Outcome: Acknowledges education.    Affect/Mood: Appropriate   Participation Level: Engaged   Participation Quality: Independent   Behavior: Appropriate   Speech/Thought Process: Focused   Insight: Good   Judgement: Good   Modes of Intervention: Teaching laboratory technician   Patient Response to Interventions:  Engaged   Education Outcome:  In group clarification offered    Clinical Observations/Individualized Feedback: Patient attended session and interacted appropriately with therapy dog and peers. Patient asked appropriate questions about therapy dog and his training. Patient shared stories about their pets at home with group.     Plan: Continue to engage patient in RT group sessions 2-3x/week.   Sera Hitsman-McCall, LRT,CTRS 07/19/2023 12:53 PM
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
## 2023-07-19 NOTE — Plan of Care (Signed)
  Problem: Education: Goal: Knowledge of [facility] Education information/materials will improve 07/19/2023 2205 by Ria Clock, RN Outcome: Progressing 07/19/2023 1057 by Ria Clock, RN Outcome: Progressing Goal: Emotional status will improve 07/19/2023 2205 by Ria Clock, RN Outcome: Progressing 07/19/2023 1057 by Ria Clock, RN Outcome: Progressing Goal: Mental status will improve 07/19/2023 2205 by Ria Clock, RN Outcome: Progressing 07/19/2023 1057 by Ria Clock, RN Outcome: Progressing Goal: Verbalization of understanding the information provided will improve 07/19/2023 2205 by Ria Clock, RN Outcome: Progressing 07/19/2023 1057 by Ria Clock, RN Outcome: Progressing   Problem: Activity: Goal: Interest or engagement in activities will improve 07/19/2023 2205 by Ria Clock, RN Outcome: Progressing 07/19/2023 1057 by Ria Clock, RN Outcome: Progressing Goal: Sleeping patterns will improve 07/19/2023 2205 by Ria Clock, RN Outcome: Progressing 07/19/2023 1057 by Ria Clock, RN Outcome: Progressing

## 2023-07-20 DIAGNOSIS — F332 Major depressive disorder, recurrent severe without psychotic features: Principal | ICD-10-CM

## 2023-07-20 MED ORDER — HYDROXYZINE HCL 25 MG PO TABS: 25.0000 mg | ORAL_TABLET | Freq: Three times a day (TID) | ORAL | 0 refills | Status: AC | PRN

## 2023-07-20 MED ORDER — DULOXETINE HCL 40 MG PO CPEP: 40.0000 mg | ORAL_CAPSULE | Freq: Every day | ORAL | 0 refills | Status: AC

## 2023-07-20 MED ORDER — LIDOCAINE 5 % EX PTCH: 1.0000 | MEDICATED_PATCH | Freq: Every day | CUTANEOUS | 0 refills | Status: DC

## 2023-07-20 MED ORDER — TRAZODONE HCL 50 MG PO TABS: 50.0000 mg | ORAL_TABLET | Freq: Every evening | ORAL | 0 refills | Status: AC | PRN
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
## 2023-07-20 NOTE — [facility] Group Notes (Signed)
Spiritual care group on grief and loss facilitated by Chaplain Dyanne Carrel, Bcc and Arlyce Dice, Mdiv  Group Goal: Support / Education around grief and loss  Members engage in facilitated group support and psycho-social education.  Group Description:  Following introductions and group rules, group members engaged in facilitated group dialogue and support around topic of loss, with particular support around experiences of loss in their lives. Group Identified types of loss (relationships / self / things) and identified patterns, circumstances, and changes that precipitate losses. Reflected on thoughts / feelings around loss, normalized grief responses, and recognized variety in grief experience. Group encouraged individual reflection on safe space and on the coping skills that they are already utilizing.  Group drew on Adlerian / Rogerian and narrative framework  Patient Progress: Jackson [name] attended group and actively engaged and participated in group conversation and activities.  His comments demonstrated good insight and contributed positively to the conversation. Jackson [name] became tearful during group.  Chaplain checked in after group, but Jackson [name] declined and stated that they are doing okay.

## 2023-07-20 NOTE — Progress Notes (Signed)
  [facility] Adult Case Management Discharge Plan :  Will you be returning to the same living situation after discharge:  No.Larry Holsworth [phone] (Friend) At discharge, do you have transportation home?: Yes,  Edmonia Caprio [phone] (Friend) Do you have the ability to pay for your medications: Yes,  Insured [employer]  Release of information consent forms completed and in the chart;  Patient's signature needed at discharge.  Patient to Follow up at:  Follow-up Information     [facility] Follow up on 07/28/2023.   Why: You have a hospital follow up appointment for therapy and medication management services on 07/28/23 at 10:00 am.  This will be a Virtual telehealth appt. Contact information: [address]  [address] [place] [address] [phone]                 Next level of care provider has access to [facility] Link:no  Safety Planning and Suicide Prevention discussed: Andrey Farmer [phone] (Friend)     Has patient been referred to the Quitline?: Patient does not use tobacco/nicotine products  Patient has been referred for addiction treatment: Yes, the patient will follow up with an outpatient provider for substance use disorder. Psychiatrist/APP: appointment made and Therapist: appointment made [facility] Patient to continue working towards treatment goals after discharge. Patient no longer meets criteria for inpatient criteria per attending physician. Continue taking medications as prescribed, nursing to provide instructions at discharge. Follow up with all scheduled appointments.   Kody Vigil S Willies Laviolette, LCSW 07/20/2023, 10:13 AM

## 2023-07-20 NOTE — Progress Notes (Signed)
Patient discharged to home accompanied by friend. Discharge instructions, all required discharge documents and information about follow-up appointment given to pt with verbalization of understanding. All personal belongings recorded on belonging sheet returned to pt at time of discharge. Plan of Care resolved. Pt denied SI/HI and AVH at time of discharge. Pt escorted to lobby by RN at 1645.  07/20/23 0810  Psych Admission Type (Psych Patients Only)  Admission Status Involuntary  Psychosocial Assessment  Patient Complaints Anxiety  Eye Contact Fair  Facial Expression Animated  Affect Appropriate to circumstance  Speech Logical/coherent  Interaction Assertive  Motor Activity Fidgety  Appearance/Hygiene Unremarkable  Behavior Characteristics Appropriate to situation  Mood Anxious  Thought Process  Coherency WDL  Content WDL  Delusions None reported or observed  Perception WDL  Hallucination None reported or observed  Judgment WDL  Confusion None  Danger to Self  Current suicidal ideation? Denies  Self-Injurious Behavior No self-injurious ideation or behavior indicators observed or expressed   Agreement Not to Harm Self Yes  Description of Agreement Verbal  Danger to Others  Danger to Others None reported or observed

## 2023-07-20 NOTE — Plan of Care (Signed)
  Problem: Activity: Goal: Sleeping patterns will improve Outcome: Progressing   Problem: Education: Goal: Knowledge of  General Education information/materials will improve Outcome: Progressing Goal: Emotional status will improve Outcome: Progressing Goal: Mental status will improve Outcome: Progressing Goal: Verbalization of understanding the information provided will improve Outcome: Progressing   Problem: Coping: Goal: Ability to verbalize frustrations and anger appropriately will improve Outcome: Progressing Goal: Ability to demonstrate self-control will improve Outcome: Progressing   Problem: Physical Regulation: Goal: Ability to maintain clinical measurements within normal limits will improve Outcome: Progressing   Problem: Safety: Goal: Periods of time without injury will increase Outcome: Progressing

## 2023-07-20 NOTE — BHH Suicide Risk Assessment (Signed)
[facility] Discharge Suicide Risk Assessment   Principal Problem: MDD (major depressive disorder), recurrent severe, without psychosis (HCC) Discharge Diagnoses: Principal Problem:   MDD (major depressive disorder), recurrent severe, without psychosis (HCC) Active Problems:   GAD (generalized anxiety disorder)   PTSD (post-traumatic stress disorder)   Total Time spent with patient: 30 minutes  Musculoskeletal: Strength & Muscle Tone: within normal limits Gait & Station: normal Patient leans: N/A  Psychiatric Specialty Exam  Presentation  General Appearance:  Appropriate for Environment  Eye Contact: Good  Speech: Normal Rate  Speech Volume: Normal  Handedness: Right   Mood and Affect  Mood: Euthymic  Duration of Depression Symptoms: Greater than two weeks  Affect: Full Range   Thought Process  Thought Processes: Linear  Descriptions of Associations:Intact  Orientation:Full (Time, Place and Person)  Thought Content:Logical  History of Schizophrenia/Schizoaffective disorder:No  Duration of Psychotic Symptoms:No data recorded Hallucinations:Hallucinations: None  Ideas of Reference:None  Suicidal Thoughts:Suicidal Thoughts: No  Homicidal Thoughts:Homicidal Thoughts: No   Sensorium  Memory: Immediate Good; Remote Good  Judgment: Fair  Insight: Fair   Art therapist  Concentration: Fair  Attention Span: Fair  Recall: Good  Fund of Knowledge: Good  Language: Good   Psychomotor Activity  Psychomotor Activity: Psychomotor Activity: Normal   Assets  Assets: Communication Skills; Physical Health; Desire for Improvement; Social Support   Sleep  Sleep: Sleep: Fair   Physical Exam: Physical Exam Vitals and nursing note reviewed.  Constitutional:      Appearance: Normal appearance.  HENT:     Head: Normocephalic and atraumatic.  Eyes:     Extraocular Movements: Extraocular movements intact.  Pulmonary:      Effort: Pulmonary effort is normal.  Musculoskeletal:        General: Normal range of motion.     Cervical back: Normal range of motion.  Neurological:     General: No focal deficit present.     Mental Status: Jackson [name] is alert and oriented to person, place, and time.  Psychiatric:        Behavior: Behavior normal.    Review of Systems  Constitutional:  Negative for fever.  HENT:  Negative for hearing loss.   Respiratory:  Negative for cough and shortness of breath.   Cardiovascular:  Negative for chest pain.  Gastrointestinal:  Negative for constipation, diarrhea, nausea and vomiting.  Genitourinary:  Negative for dysuria.  Musculoskeletal:  Positive for back pain. Negative for myalgias.  Skin:  Negative for rash.  Neurological:  Negative for dizziness, tremors and headaches.  Psychiatric/Behavioral:  Negative for hallucinations and suicidal ideas.    Blood pressure 118/87, pulse 73, temperature 98.2 F (36.8 C), temperature source Oral, resp. rate 16, height 5\' 9"  (1.753 m), weight 63.5 kg, SpO2 99%. Body mass index is 20.67 kg/m.  Mental Status Per Nursing Assessment::   On Admission:  NA  Demographic Factors:  Male, Caucasian, Gay, lesbian, or bisexual orientation, and Living alone  Loss Factors: Loss of significant relationship  Historical Factors: Prior suicide attempts and Impulsivity  Risk Reduction Factors:   Responsible for children under 67 years of age, Religious beliefs about death, Positive social support, and Positive coping skills or problem solving skills  Continued Clinical Symptoms:  Depression:   Impulsivity Alcohol/Substance Abuse/Dependencies Chronic Pain  Cognitive Features That Contribute To Risk:  None    Suicide Risk:  Mild:  Suicidal ideation of limited frequency, intensity, duration, and specificity.  There are no identifiable plans, no associated intent, mild dysphoria and related  symptoms, good self-control (both objective and subjective  assessment), few other risk factors, and identifiable protective factors, including available and accessible social support.   Follow-up Information     [facility] Follow up on 07/28/2023.   Why: You have a hospital follow up appointment for therapy and medication management services on 07/28/23 at 10:00 am.  This will be a Virtual telehealth appt. Contact information: [address]  [address] [place] [address] [phone]                 Plan Of Care/Follow-up recommendations:  Activity:  ad lib Diet:  low sodium, low fat  Roselle Locus, MD 07/20/2023, 9:55 AM

## 2023-07-20 NOTE — Group Note (Signed)
Recreation Therapy Group Note   Group Topic:Team Building  Group Date: 07/20/2023 Start Time: 0930 End Time: 1005 Facilitators: Thula Stewart-McCall, LRT,CTRS Location: [address] Dayroom   Goal Area(s) Addresses:  Patient will effectively work with peer towards shared goal.  Patient will identify skills used to make activity successful.  Patient will identify how skills used during activity can be used to reach post d/c goals.   Intervention: STEM Activity  Group Description: Straw Bridge. In teams of 3-5, patients were given 15 plastic drinking straws and an equal length of masking tape. Using the materials provided, patients were instructed to build a free standing bridge-like structure to suspend an everyday item (ex: puzzle box) off of the floor or table surface. All materials were required to be used by the team in their design. LRT facilitated post-activity discussion reviewing team process. Patients were encouraged to reflect how the skills used in this activity can be generalized to daily life post discharge.   Education: Pharmacist, community, Scientist, physiological, Discharge Planning   Education Outcome: Acknowledges education/In group clarification offered/Needs additional education.    Affect/Mood: Appropriate   Participation Level: Engaged   Participation Quality: Independent   Behavior: Appropriate   Speech/Thought Process: Focused   Insight: Good   Judgement: Good   Modes of Intervention: STEM Activity   Patient Response to Interventions:  Engaged   Education Outcome:  In group clarification offered    Clinical Observations/Individualized Feedback: Pt was engaged and bright. Pt assisted in coming up with ideas and constructing a bridge that would hold the puzzle box.     Plan: Continue to engage patient in RT group sessions 2-3x/week.   Jackson [name], LRT,CTRS 07/20/2023 12:43 PM

## 2024-05-03 ENCOUNTER — Ambulatory Visit: Payer: Self-pay

## 2024-05-03 NOTE — Telephone Encounter (Signed)
 FYI Only or Action Required?: FYI only for provider.  Patient was last seen in primary care on N/A., new patient.  Called Nurse Triage reporting Depression and Post-Traumatic Stress Disorder.  Symptoms began several years ago.  Interventions attempted: Other: CBD, magnesium  soaks, cannabis.  Symptoms are: chronic pain in extremities and neck, poor appetite, fingers weakness, depression gradually worsening.  Triage Disposition: See PCP When Office is Open (Within 3 Days)- Patient set up with new patient appointment tomorrow and patient states he has the information for [facility].  Patient/caregiver understands and will follow disposition?: Yes        Copied from CRM #[id number]. Topic: Clinical - Red Word Triage >> May 03, 2024 12:24 PM Rosaria BRAVO wrote: Red Word that prompted transfer to Nurse Triage: Pt has chronic pain, depression, and ptsd. Seeking new patient appt urgent.    ----------------------------------------------------------------------- From previous Reason for Contact - Scheduling: Patient/patient representative is calling to schedule an appointment. Refer to attachments for appointment information. Reason for Disposition  Requesting to talk with a counselor (mental health worker, psychiatrist, etc.)  Answer Assessment - Initial Assessment Questions 1. CONCERN: What happened that made you call today?     Patient states they are calling in for a new patient appointment and states chronic pain for years.  2. DEPRESSION SYMPTOM SCREENING: How are you feeling overall? 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 States sometimes feels like fight or flight, panic or anxiety. Depression related to pain.  3. RISK OF HARM - SUICIDAL IDEATION:  Do you ever  have thoughts of hurting or killing yourself?  (e.g., yes, no, no but preoccupation with thoughts about death)     In the past, yes but states since being sober for 9 months that has not been occurring.  4. RISK OF HARM - HOMICIDAL IDEATION:  Do you ever have thoughts of hurting or killing someone else?  (e.g., yes, no, no but preoccupation with thoughts about death)     No.  5. FUNCTIONAL IMPAIRMENT: How have things been going for you overall? Have you had more difficulty than usual doing your normal daily activities?  (e.g., better, same, worse; self-care, school, work, interactions)     Patient has been sleeping in their car and states unable to get good sleep.  6. SUPPORT: Who is with you now? Who do you live with? Do you have family or friends who you can talk to?      Yes and no. States ex wife and mother but they are both toxic.  7. THERAPIST: Do you have a counselor or therapist? If Yes, ask: What is their name?     No, states they are working on that and is requesting a Child psychotherapist.  8. STRESSORS: Has there been any new stress or recent changes in your life?     Being separated from wife, states that has made it limited due to resources. Not being divorced means having to include the ex wife's income for household income and has limited what resources are available. States not being able to see their 41 year old son has been stressful too.  9. ALCOHOL USE OR SUBSTANCE USE (DRUG USE): Do you drink alcohol or use any illegal drugs?     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  10.  OTHER: Do you have any other physical symptoms right now? (e.g., fever)       Extremity pain (bilateral arms and legs), neck pain, fingers weakness, poor appetite.  11. PREGNANCY: Is there any chance you are pregnant? When was your last menstrual period?       N/A.  Protocols used: Depression-A-AH

## 2024-05-04 ENCOUNTER — Ambulatory Visit (INDEPENDENT_AMBULATORY_CARE_PROVIDER_SITE_OTHER): Payer: MEDICAID | Admitting: Internal Medicine

## 2024-05-04 VITALS — BP 108/70 | HR 62 | Temp 98.7°F | Ht 69.0 in | Wt 146.6 lb

## 2024-05-04 DIAGNOSIS — Z789 Other specified health status: Secondary | ICD-10-CM

## 2024-05-04 DIAGNOSIS — F411 Generalized anxiety disorder: Secondary | ICD-10-CM

## 2024-05-04 DIAGNOSIS — F332 Major depressive disorder, recurrent severe without psychotic features: Secondary | ICD-10-CM | POA: Diagnosis not present

## 2024-05-04 DIAGNOSIS — K625 Hemorrhage of anus and rectum: Secondary | ICD-10-CM

## 2024-05-04 DIAGNOSIS — F4310 Post-traumatic stress disorder, unspecified: Secondary | ICD-10-CM | POA: Diagnosis not present

## 2024-05-04 DIAGNOSIS — J069 Acute upper respiratory infection, unspecified: Secondary | ICD-10-CM

## 2024-05-04 MED ORDER — CETIRIZINE HCL 10 MG PO TABS
10.0000 mg | ORAL_TABLET | Freq: Every day | ORAL | 11 refills | Status: AC
Start: 2024-05-04 — End: ?

## 2024-05-04 MED ORDER — AZITHROMYCIN 250 MG PO TABS
ORAL_TABLET | ORAL | 0 refills | Status: AC
Start: 2024-05-04 — End: 2024-05-09

## 2024-05-04 MED ORDER — FLUTICASONE PROPIONATE 50 MCG/ACT NA SUSP
2.0000 | Freq: Every day | NASAL | 6 refills | Status: AC
Start: 2024-05-04 — End: ?

## 2024-05-04 NOTE — Progress Notes (Unsigned)
 [facility] [address] [place] [address] Dept: [phone] Dept Fax: [phone]  New Patient Office Visit  Subjective:   Jackson [name] Oct 28, 1980 05/04/2024  Chief Complaint  Patient presents with   Establish Care   URI    Started couple days ago coughing started yesterday     HPI: Jackson [name] presents today to establish care at [facility] [employer]. Introduced to Publishing rights manager role and practice setting.  All questions answered.  Concerns: See below    Discussed the use of AI scribe software for clinical note transcription with the patient, who gave verbal consent to proceed.  History of Present Illness   Jackson [name] is a 43 year old who presents for establishment of care and evaluation of ongoing gastrointestinal issues and upper respiratory symptoms.  They have a history of anxiety, depression, and PTSD, but are not currently taking any medications for these conditions. They experience difficulty in organizing and prioritizing tasks due to life stressors and are seeking assistance from a Child psychotherapist.  They underwent surgery at age 29 for intussusception, during which a foot and a half of their small intestine was removed. This surgery led to weight loss and changes in eating habits. They have a history of iron deficiency anemia during their teenage years but are not currently on iron supplements. They experience ongoing gastrointestinal issues, including intermittent blood in their stool, described as 'darker red blobs.' A colonoscopy performed a few years ago revealed a benign polyp. They have not been taking regular medications for these symptoms but have been fasting due to a lack of appetite. Their grandmother, a Engineer, civil (consulting), is concerned about these symptoms.  They are experiencing upper respiratory symptoms, including a scratchy throat for a few days, followed by coughing,  sneezing, sinus pressure, and a headache. No fever is reported. They have been using a cough suppressant at night to aid sleep. They have a history of smoking, which they resumed due to stress.  They are currently experiencing housing instability, staying in their car or house-sitting when possible.       The following portions of the patient's history were reviewed and updated as appropriate: past medical history, past surgical history, family history, social history, allergies, medications, and problem list.   Patient Active Problem List   Diagnosis Date Noted   MDD (major depressive disorder), recurrent severe, without psychosis (HCC) 07/15/2023   GAD (generalized anxiety disorder) 07/15/2023   PTSD (post-traumatic stress disorder) 07/15/2023   Past Medical History:  Diagnosis Date   Abdominal cramping    Anemia    Anxiety    Arthritis    Clotting disorder (HCC)    Depression    Neuromuscular disorder (HCC)    Rectal bleeding    Substance abuse (HCC)    Toenail bruise    No past surgical history on file. Family History  Problem Relation Age of Onset   Heart attack Mother    Heart disease Mother    Hypothyroidism Mother    Heart disease Father     Current Outpatient Medications:    DULoxetine  40 MG CPEP, Take 1 capsule (40 mg total) by mouth daily., Disp: 30 capsule, Rfl: 0   hydrOXYzine  (ATARAX ) 25 MG tablet, Take 1 tablet (25 mg total) by mouth 3 (three) times daily as needed for anxiety., Disp: 30 tablet, Rfl: 0   lidocaine  (LIDODERM ) 5 %, Place 1 patch onto the skin daily. Remove & Discard patch within  12 hours or as directed by MD, Disp: 30 patch, Rfl: 0   traZODone  (DESYREL ) 50 MG tablet, Take 1 tablet (50 mg total) by mouth at bedtime as needed for sleep., Disp: 30 tablet, Rfl: 0 No Known Allergies  ROS: A complete ROS was performed with pertinent positives/negatives noted in the HPI. The remainder of the ROS are negative.   Objective:   Today's Vitals    05/04/24 0908  BP: 108/70  Pulse: 62  Temp: 98.7 F (37.1 C)  TempSrc: Temporal  SpO2: 99%  Weight: 146 lb 9.6 oz (66.5 kg)  Height: 5' 9 (1.753 m)    GENERAL: Well-appearing, in NAD. Well nourished.  SKIN: Pink, warm and dry. No rash, lesion, ulceration, or ecchymoses.  NECK: Trachea midline. Full ROM w/o pain or tenderness. No lymphadenopathy.  RESPIRATORY: Chest wall symmetrical. Respirations even and non-labored. Breath sounds clear to auscultation bilaterally.  CARDIAC: S1, S2 present, regular rate and rhythm. Peripheral pulses 2+ bilaterally.  MSK: Muscle tone and strength appropriate for age. Joints w/o tenderness, redness, or swelling.  EXTREMITIES: Without clubbing, cyanosis, or edema.  NEUROLOGIC: No motor or sensory deficits. Steady, even gait.  PSYCH/MENTAL STATUS: Alert, oriented x 3. Cooperative, appropriate mood and affect.   Health Maintenance Due  Topic Date Due   HIV Screening  Never done   Hepatitis C Screening  Never done    No results found for any visits on 05/04/24.  Assessment & Plan:  Assessment and Plan    Upper Respiratory Infection Acute viral upper respiratory infection with symptoms of scratchy throat, cough, sneezing, sinus pressure, and nasal congestion. No fever. Prescribed azithromycin  to prevent sinus infection. - Prescribed azithromycin  (Z-Pak) for 5 days: 2 tablets on day 1, then 1 tablet daily for 4 days. - Recommended over-the-counter cough suppressant and decongestant. - Prescribed Flonase  nasal spray. - Prescribed Zyrtec  for allergy relief.  Chronic Gastrointestinal Bleeding Intermittent dark red blood in stool with previous benign polyp. Differential includes upper GI source or other pathology. Referral to Gastroenterology for further evaluation. - Refer to Gastroenterology for further evaluation and management.  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  Mental Health  Concerns (Anxiety, Depression, PTSD) Difficulty with organization and prioritization due to life stressors. Referral to social work for support and resources. - Refer to social work for assessment and community resources. - Advise visiting behavioral health for further mental health support.  General Health Maintenance Need for routine health maintenance and screenings. - Schedule an annual physical examination in 3 months.       No orders of the defined types were placed in this encounter.  No orders of the defined types were placed in this encounter.   No follow-ups on file.   Rosina Senters, FNP

## 2024-05-30 ENCOUNTER — Telehealth: Payer: Self-pay

## 2024-05-30 NOTE — Progress Notes (Signed)
 Complex Care Management Note Care Guide Note  05/30/2024 Name: Jackson [name] MRN: [id number] DOB: [birth year]   Complex Care Management Outreach Attempts: An unsuccessful telephone outreach was attempted today to offer the patient information about available complex care management services.  Follow Up Plan:  Additional outreach attempts will be made to offer the patient complex care management information and services.   Encounter Outcome:  No Answer  Dreama [facility]  [facility], [facility] VBCI Assistant Direct Dial: [phone]  Fax: [phone]

## 2024-06-01 NOTE — Progress Notes (Signed)
 Complex Care Management Note Care Guide Note  06/01/2024 Name: Jackson [name] MRN: [id number] DOB: 04-05-1981   Complex Care Management Outreach Attempts: A second unsuccessful outreach was attempted today to offer the patient with information about available complex care management services.  Follow Up Plan:  Additional outreach attempts will be made to offer the patient complex care management information and services.   Encounter Outcome:  No Answer  Dreama [facility]  [facility], [facility] VBCI Assistant Direct Dial: [phone]  Fax: [phone]

## 2024-06-04 NOTE — Progress Notes (Signed)
 Complex Care Management Note Care Guide Note  06/04/2024 Name: Jackson [name] MRN: [id number] DOB: [birth year]   Complex Care Management Outreach Attempts: A third unsuccessful outreach was attempted today to offer the patient with information about available complex care management services.  Follow Up Plan:  No further outreach attempts will be made at this time. We have been unable to contact the patient to offer or enroll patient in complex care management services.  Encounter Outcome:  No Answer  Dreama [facility]  [facility], [facility] VBCI Assistant Direct Dial: [phone]  Fax: [phone]

## 2024-06-04 NOTE — Progress Notes (Signed)
 Complex Care Management Note Care Guide Note  06/04/2024 Name: Jackson [name] MRN: [id number] DOB: Jan 25, 1981   Complex Care Management Outreach Attempts: An unsuccessful telephone outreach was attempted today to offer the patient information about available complex care management services.  Follow Up Plan:  No further outreach attempts will be made at this time. We have been unable to contact the patient to offer or enroll patient in complex care management services.  Encounter Outcome:  No Answer  Dreama [facility]  [facility], [facility] VBCI Assistant Direct Dial: [phone]  Fax: [phone]

## 2024-06-04 NOTE — Progress Notes (Signed)
 Complex Care Management Note  Care Guide Note 06/04/2024 Name: Jackson [name] MRN: [id number] DOB: 1981/09/17  Jackson [name] is a 43 y.o. year old adult who sees Billy Knee, FNP for primary care. I reached out to Mabel Ree by phone today to offer complex care management services.    Roper was given information about Complex Care Management services today including:   The Complex Care Management services include support from the care team which includes your Nurse Care Manager, Clinical Social Worker, or Pharmacist.  The Complex Care Management team is here to help remove barriers to the health concerns and goals most important to you. Complex Care Management services are voluntary, and the patient may decline or stop services at any time by request to their care team member.   Complex Care Management Consent Status: Patient agreed to services and verbal consent obtained.   Follow up plan:  Telephone appointment with complex care management team member scheduled for:  06/08/24 at 3:00 p.m.   Encounter Outcome:  Patient Scheduled  Dreama [facility]  [facility], [facility] VBCI Assistant Direct Dial: [phone]  Fax: [phone]

## 2024-06-08 ENCOUNTER — Other Ambulatory Visit: Payer: MEDICAID

## 2024-06-08 NOTE — Patient Instructions (Signed)
 Visit Information  Thank you for taking time to visit with me today. Please don't hesitate to contact me if I can be of assistance to you before our next scheduled appointment.  Our next appointment is no further scheduled appointments.   Please call the care guide team at [phone] if you need to cancel or reschedule your appointment.   Please call the Suicide and Crisis Lifeline: 988 call the [place]  National Suicide Prevention Lifeline: 920-258-5540 or TTY: 8651411891 TTY (802) 001-7399) to talk to a trained counselor call 1-800-273-TALK (toll free, 24 hour hotline) go to [facility] [address] [place] [phone]) call 911 if you are experiencing a Mental Health or Behavioral Health Crisis or need someone to talk to.  Patient verbalizes understanding of instructions and care plan provided today and agrees to view in MyChart. Active MyChart status and patient understanding of how to access instructions and care plan via MyChart confirmed with patient.     Orlean Fey, BSW [place]  [facility] Social Worker, [facility] [phone]

## 2024-06-08 NOTE — Patient Outreach (Addendum)
 Complex Care Management   Visit Note  06/08/2024  Name:  Jackson [name] MRN: [id number] DOB: [birth year]  Situation: Referral received for Complex Care Management related to connecting patient with [facility] , Housing , Food insecurity, Financial Strain I obtained verbal consent from Patient.  Visit completed with Patient  on the phone  Background:   Past Medical History:  Diagnosis Date   Anemia    Anxiety    Arthritis    Colon polyp    Depression    Hemorrhoid    Intussusception intestine (HCC)    Neuromuscular disorder (HCC)    Rectal bleeding    Substance abuse (HCC)    Toenail bruise     Assessment:  BSW called patient today to assess for SDOH needs and to connect them to their [facility] healthcare plan. During the call, BSW determined that patient's barriers include food insecurity, housing, and financial strain. Patient reported that they have been homeless for the past six months and are currently living in their car. Patient also expressed interest in obtaining an adult autism evaluation and pain management services. BSW connected patient with [facility] during the call. [facility] informed patient that their assigned care management is through [employer] and provided the phone number [phone]. The call reference number with [facility] is [phone]. Patient stated they will contact [facility] to connect with their case manager. BSW provided resources for food, housing, and financial strain. BSW informed patient to reach out if they had any further questions or needs. Case will be closed at this time.   Recommendation:   No recommendations at this time.  Follow Up Plan:   Patient has met all care management goals. Care Management case will be closed. Patient has been provided contact information should new needs arise.   Orlean Fey, BSW [place]  [facility] Social Worker, [facility] [phone]

## 2024-06-27 ENCOUNTER — Other Ambulatory Visit: Payer: Self-pay | Admitting: Gastroenterology

## 2024-06-27 DIAGNOSIS — K921 Melena: Secondary | ICD-10-CM

## 2024-06-27 DIAGNOSIS — Q859 Phakomatosis, unspecified: Secondary | ICD-10-CM

## 2024-08-03 ENCOUNTER — Encounter: Payer: MEDICAID | Admitting: Internal Medicine

## 2024-08-06 ENCOUNTER — Encounter: Payer: Self-pay | Admitting: Internal Medicine
